# Patient Record
Sex: Female | Born: 1975 | Race: Black or African American | Hispanic: No | Marital: Married | Smoking: Never smoker
Health system: Southern US, Community
[De-identification: ages and names within clinical notes are randomized; demographics above are authoritative.]

## PROBLEM LIST (undated history)

## (undated) DIAGNOSIS — E079 Disorder of thyroid, unspecified: Secondary | ICD-10-CM

## (undated) DIAGNOSIS — I10 Essential (primary) hypertension: Secondary | ICD-10-CM

## (undated) DIAGNOSIS — I2510 Atherosclerotic heart disease of native coronary artery without angina pectoris: Secondary | ICD-10-CM

## (undated) DIAGNOSIS — N83209 Unspecified ovarian cyst, unspecified side: Secondary | ICD-10-CM

## (undated) DIAGNOSIS — D250 Submucous leiomyoma of uterus: Secondary | ICD-10-CM

## (undated) DIAGNOSIS — G43909 Migraine, unspecified, not intractable, without status migrainosus: Secondary | ICD-10-CM

## (undated) DIAGNOSIS — E785 Hyperlipidemia, unspecified: Secondary | ICD-10-CM

## (undated) DIAGNOSIS — Z803 Family history of malignant neoplasm of breast: Secondary | ICD-10-CM

## (undated) HISTORY — DX: Family history of malignant neoplasm of breast: Z80.3

## (undated) HISTORY — PX: COLONOSCOPY: SHX174

## (undated) HISTORY — PX: LAPAROSCOPY: SHX197

## (undated) HISTORY — PX: CORONARY ANGIOPLASTY: SHX604

---

## 2000-07-17 ENCOUNTER — Other Ambulatory Visit: Admission: RE | Admit: 2000-07-17 | Discharge: 2000-07-17 | Payer: Self-pay | Admitting: *Deleted

## 2000-09-26 ENCOUNTER — Encounter (INDEPENDENT_AMBULATORY_CARE_PROVIDER_SITE_OTHER): Payer: Self-pay | Admitting: Specialist

## 2000-09-26 ENCOUNTER — Other Ambulatory Visit: Admission: RE | Admit: 2000-09-26 | Discharge: 2000-09-26 | Payer: Self-pay | Admitting: *Deleted

## 2001-08-18 ENCOUNTER — Other Ambulatory Visit: Admission: RE | Admit: 2001-08-18 | Discharge: 2001-08-18 | Payer: Self-pay | Admitting: *Deleted

## 2002-03-16 ENCOUNTER — Encounter: Admission: RE | Admit: 2002-03-16 | Discharge: 2002-03-16 | Payer: Self-pay | Admitting: *Deleted

## 2002-03-16 ENCOUNTER — Encounter: Payer: Self-pay | Admitting: *Deleted

## 2002-09-13 ENCOUNTER — Other Ambulatory Visit: Admission: RE | Admit: 2002-09-13 | Discharge: 2002-09-13 | Payer: Self-pay | Admitting: *Deleted

## 2002-09-21 ENCOUNTER — Encounter: Payer: Self-pay | Admitting: Endocrinology

## 2002-09-28 ENCOUNTER — Encounter: Payer: Self-pay | Admitting: Endocrinology

## 2002-10-01 ENCOUNTER — Encounter: Payer: Self-pay | Admitting: Endocrinology

## 2002-10-15 ENCOUNTER — Other Ambulatory Visit: Admission: RE | Admit: 2002-10-15 | Discharge: 2002-10-15 | Payer: Self-pay | Admitting: Diagnostic Radiology

## 2002-10-19 ENCOUNTER — Encounter: Payer: Self-pay | Admitting: Endocrinology

## 2002-11-04 ENCOUNTER — Encounter: Payer: Self-pay | Admitting: Endocrinology

## 2002-11-06 ENCOUNTER — Emergency Department (HOSPITAL_COMMUNITY): Admission: EM | Admit: 2002-11-06 | Discharge: 2002-11-06 | Payer: Self-pay

## 2002-11-07 ENCOUNTER — Emergency Department (HOSPITAL_COMMUNITY): Admission: EM | Admit: 2002-11-07 | Discharge: 2002-11-07 | Payer: Self-pay | Admitting: *Deleted

## 2003-04-13 ENCOUNTER — Encounter: Admission: RE | Admit: 2003-04-13 | Discharge: 2003-04-13 | Payer: Self-pay | Admitting: *Deleted

## 2003-04-22 ENCOUNTER — Encounter: Payer: Self-pay | Admitting: Endocrinology

## 2003-04-28 ENCOUNTER — Encounter: Payer: Self-pay | Admitting: Endocrinology

## 2003-05-02 ENCOUNTER — Encounter: Payer: Self-pay | Admitting: Endocrinology

## 2003-08-22 ENCOUNTER — Encounter: Payer: Self-pay | Admitting: Endocrinology

## 2003-08-29 ENCOUNTER — Encounter: Payer: Self-pay | Admitting: Endocrinology

## 2003-09-09 ENCOUNTER — Encounter: Payer: Self-pay | Admitting: Endocrinology

## 2003-09-19 ENCOUNTER — Ambulatory Visit (HOSPITAL_COMMUNITY): Admission: RE | Admit: 2003-09-19 | Discharge: 2003-09-19 | Payer: Self-pay | Admitting: Gastroenterology

## 2003-09-19 ENCOUNTER — Encounter (INDEPENDENT_AMBULATORY_CARE_PROVIDER_SITE_OTHER): Payer: Self-pay | Admitting: Specialist

## 2003-10-03 ENCOUNTER — Other Ambulatory Visit: Admission: RE | Admit: 2003-10-03 | Discharge: 2003-10-03 | Payer: Self-pay | Admitting: *Deleted

## 2003-11-14 ENCOUNTER — Encounter: Payer: Self-pay | Admitting: Endocrinology

## 2004-02-14 ENCOUNTER — Encounter: Payer: Self-pay | Admitting: Endocrinology

## 2004-10-04 ENCOUNTER — Other Ambulatory Visit: Admission: RE | Admit: 2004-10-04 | Discharge: 2004-10-04 | Payer: Self-pay | Admitting: *Deleted

## 2005-09-30 ENCOUNTER — Other Ambulatory Visit: Admission: RE | Admit: 2005-09-30 | Discharge: 2005-09-30 | Payer: Self-pay | Admitting: *Deleted

## 2005-09-30 ENCOUNTER — Encounter: Admission: RE | Admit: 2005-09-30 | Discharge: 2005-09-30 | Payer: Self-pay | Admitting: *Deleted

## 2005-11-12 ENCOUNTER — Encounter: Payer: Self-pay | Admitting: Endocrinology

## 2005-11-15 ENCOUNTER — Encounter: Payer: Self-pay | Admitting: Endocrinology

## 2005-12-09 ENCOUNTER — Other Ambulatory Visit: Admission: RE | Admit: 2005-12-09 | Discharge: 2005-12-09 | Payer: Self-pay | Admitting: Endocrinology

## 2005-12-09 ENCOUNTER — Encounter: Payer: Self-pay | Admitting: Endocrinology

## 2006-01-28 ENCOUNTER — Encounter: Payer: Self-pay | Admitting: Endocrinology

## 2006-02-07 ENCOUNTER — Encounter: Payer: Self-pay | Admitting: Endocrinology

## 2006-02-07 ENCOUNTER — Other Ambulatory Visit: Admission: RE | Admit: 2006-02-07 | Discharge: 2006-02-07 | Payer: Self-pay | Admitting: Diagnostic Radiology

## 2006-06-06 ENCOUNTER — Encounter: Payer: Self-pay | Admitting: Endocrinology

## 2006-10-21 ENCOUNTER — Other Ambulatory Visit: Admission: RE | Admit: 2006-10-21 | Discharge: 2006-10-21 | Payer: Self-pay | Admitting: *Deleted

## 2006-10-29 ENCOUNTER — Encounter: Admission: RE | Admit: 2006-10-29 | Discharge: 2006-10-29 | Payer: Self-pay | Admitting: *Deleted

## 2006-11-04 ENCOUNTER — Encounter: Admission: RE | Admit: 2006-11-04 | Discharge: 2006-11-04 | Payer: Self-pay | Admitting: *Deleted

## 2007-04-01 ENCOUNTER — Ambulatory Visit: Payer: Self-pay | Admitting: Endocrinology

## 2007-04-01 DIAGNOSIS — E042 Nontoxic multinodular goiter: Secondary | ICD-10-CM

## 2007-04-02 LAB — CONVERTED CEMR LAB: TSH: 0.64 microintl units/mL (ref 0.35–5.50)

## 2008-12-12 IMAGING — US US SOFT TISSUE HEAD/NECK
1 series · 14 of 19 positions shown · non-contrast
Comparison: NONE

CLINICAL DATA: c: Dr. Branden Dicarlo thyroid nodule 

FINE-NEEDLE ASPIRATION BIOPSY THYROID NODULE

[Series 1: thyroid fna · 0.07mm/px · 14 of 19 slices shown]
[im 1/19]
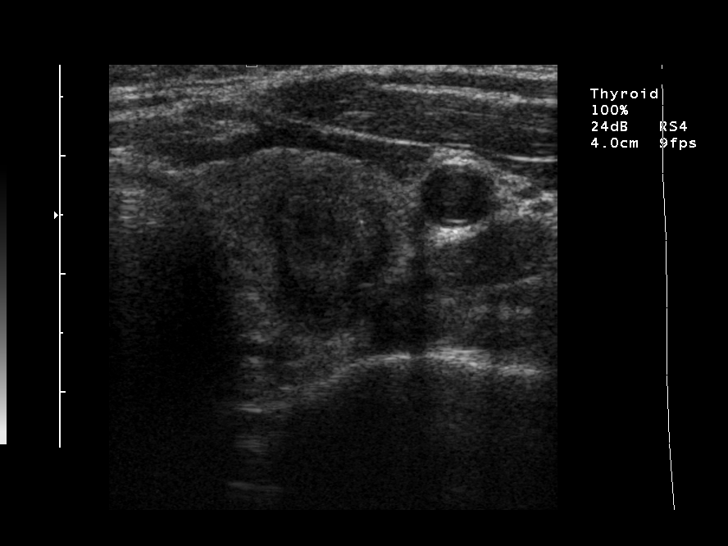
[im 3/19]
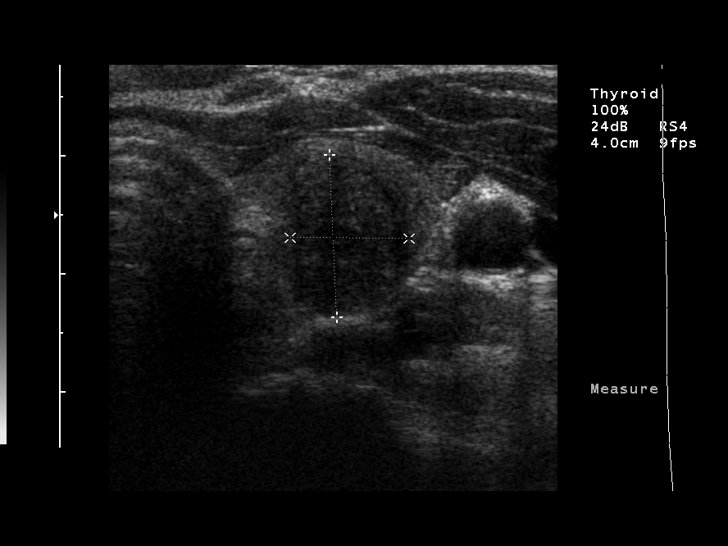
[im 4/19]
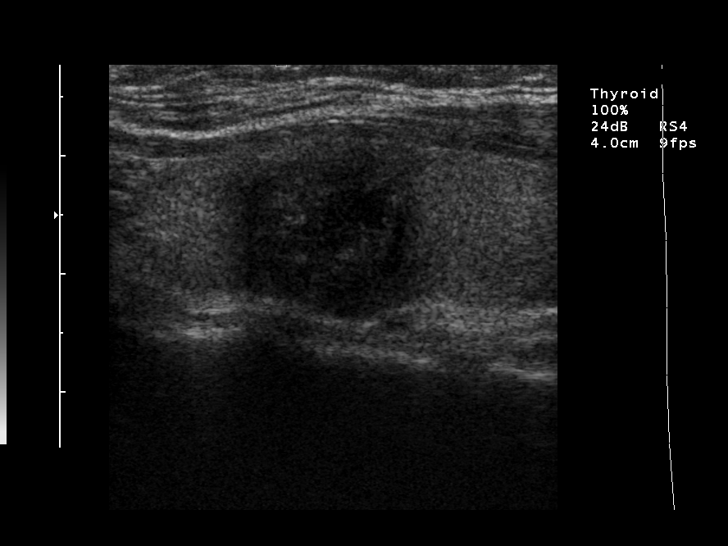
[im 5/19]
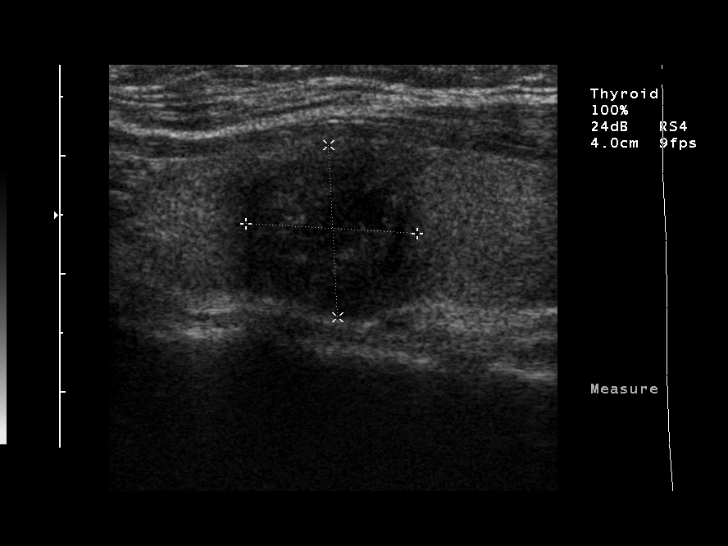
[im 7/19]
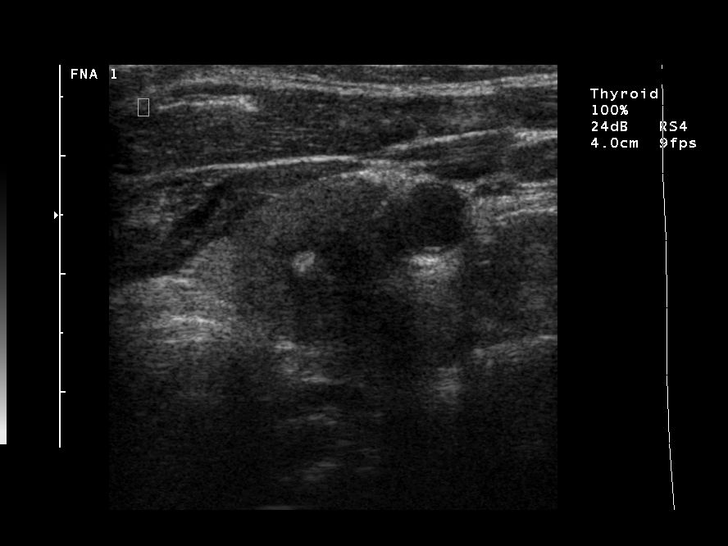
[im 8/19]
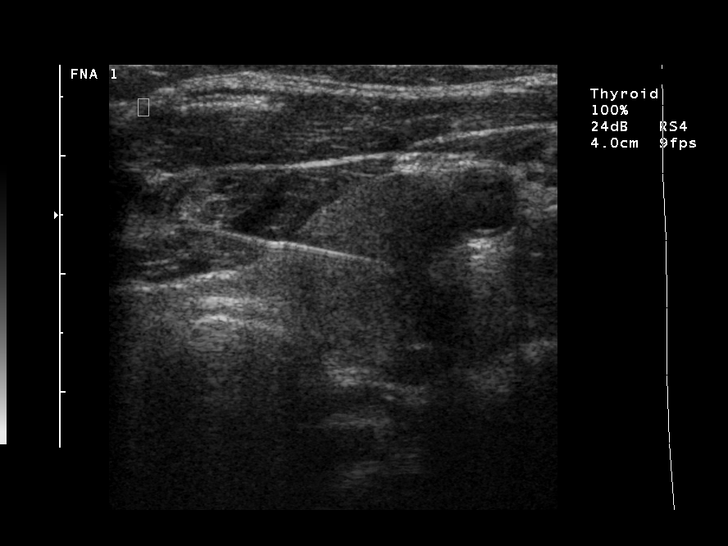
[im 9/19]
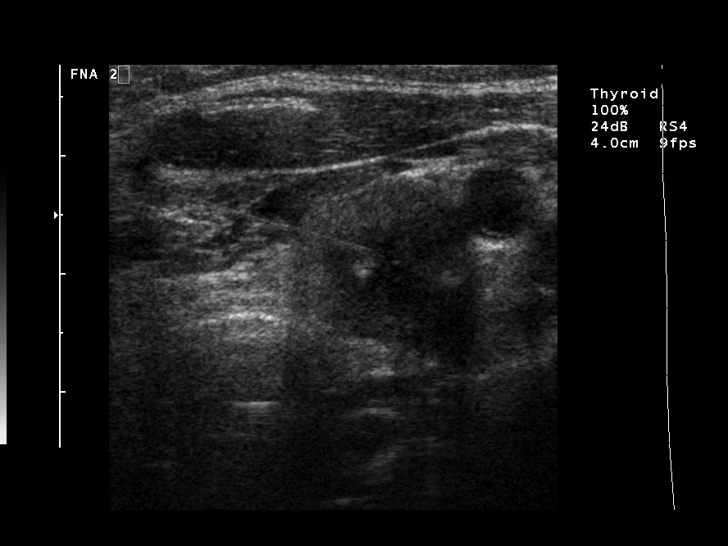
[im 11/19]
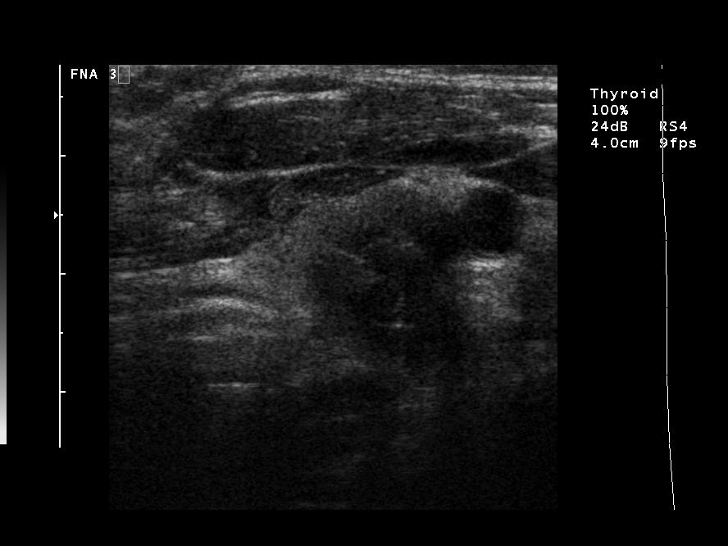
[im 12/19]
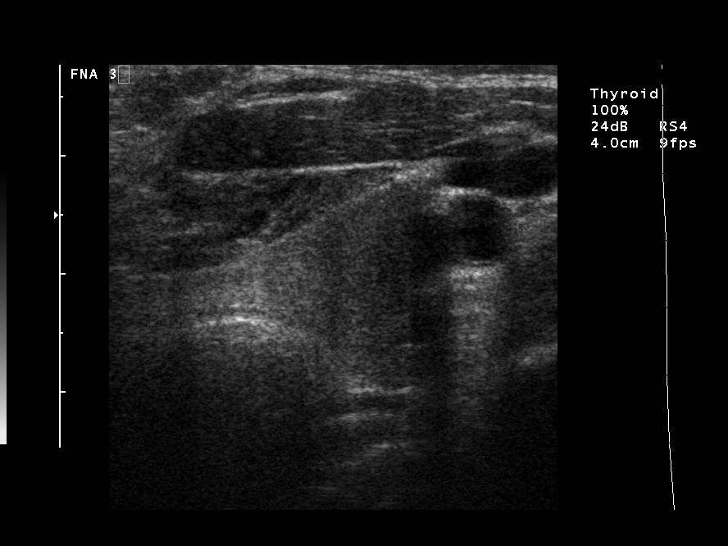
[im 13/19]
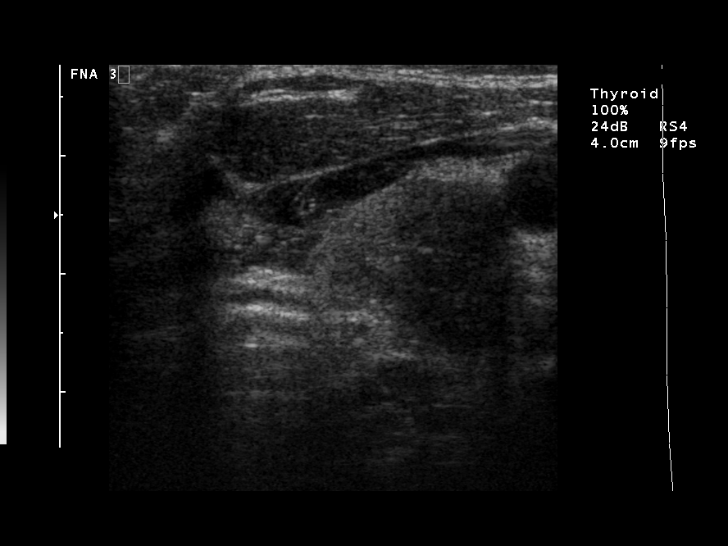
[im 15/19]
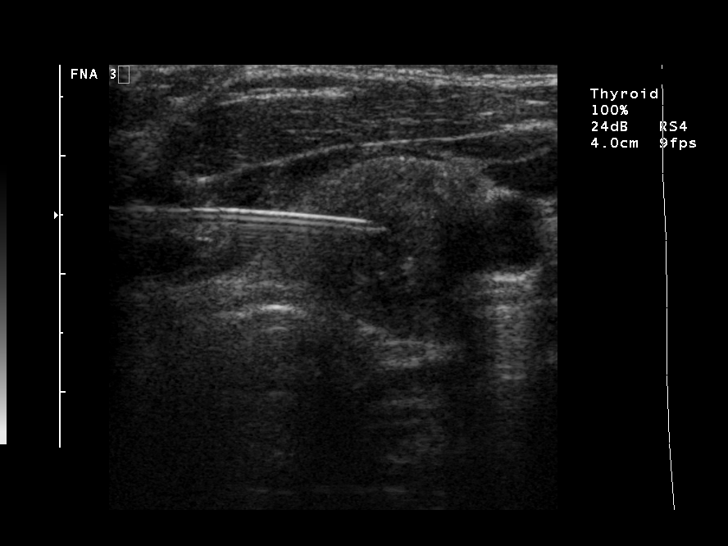
[im 16/19]
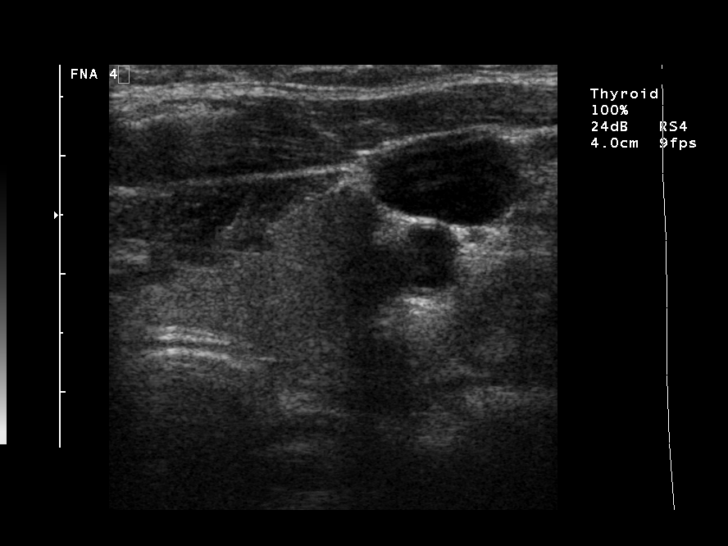
[im 17/19]
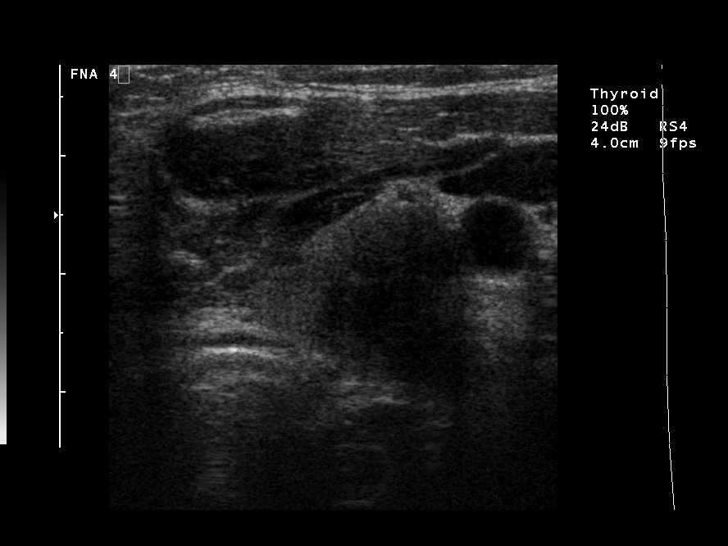
[im 19/19]
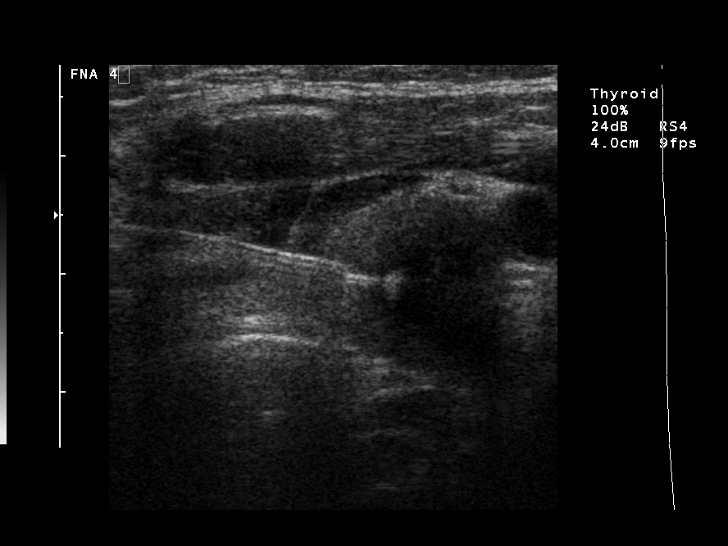

[14 of 19 positions shown; findings below may reference images not displayed]

FINDINGS: Fine-needle aspiration biopsy of a dominant solid left 
thyroid nodule was performed using three 25-gauge needle passes 
followed by two passes with a 21-gauge needle. Material was sent 
for analysis. No complications.
IMPRESSION: Successful repeat FNA of left thyroid. Relindas Auad 
02/07/2006  Tran Date: 02/10/2006 [facility]  [REDACTED]

## 2010-06-29 NOTE — Op Note (Signed)
NAME:  Deanna Solis, Deanna Solis                        ACCOUNT NO.:  [id number]   MEDICAL RECORD NO.:  [id number]                   PATIENT TYPE:  AMB   LOCATION:  ENDO                                 FACILITY:  [facility]   PHYSICIAN:  Anselmo Rod, M.D.               DATE OF BIRTH:  [birth year]   DATE OF PROCEDURE:  09/19/2003  DATE OF DISCHARGE:                                 OPERATIVE REPORT   PROCEDURE PERFORMED:  Colonoscopy with biopsies.   ENDOSCOPIST:  Charna Elizabeth, M.D.   INSTRUMENT USED:  Olympus video colonoscope.   INDICATIONS FOR PROCEDURE:  The patient is a 35 year old African-American  female with a history of rectal bleeding and a family history of colon  cancer.  Rule out colonic polyps, masses, etc.   PREPROCEDURE PREPARATION:  Informed consent was procured from the patient.  The patient was fasted for eight hours prior to the procedure and prepped  with a bottle of magnesium citrate and a gallon of GoLYTELY the night prior  to the procedure.   PREPROCEDURE PHYSICAL:  The patient had stable vital signs.  Neck supple.  Chest clear to auscultation.  S1 and S2 regular.  Abdomen soft with normal  bowel sounds.   DESCRIPTION OF PROCEDURE:  The patient was placed in left lateral decubitus  position and sedated with 100 mg of Demerol and 10 mg of Versed in slow  incremental doses.  Once the patient was adequately sedated and maintained  on low flow oxygen and continuous cardiac monitoring, the Olympus video  colonoscope was advanced from the rectum to the cecum.  The patient had some  abdominal discomfort with insufflation into the colon indicating a component  of visceral hypersensitivity most consistent with irritable bowel syndrome.  A small polyp was biopsied at 40 cm.  Small internal hemorrhoids were seen  on retroflexion.  The rest of the exam was normal.  The appendicular orifice  and ileocecal valve were clearly visualized and photographed.  The patient's  position was  changed from the left lateral supine and the right lateral  position with gentle application of abdominal pressure to reach the cecum.   IMPRESSION:  1. Small nonbleeding internal hemorrhoids.  2. Small sessile polyp biopsied at 40 cm.   RECOMMENDATIONS:  1. Continue high fiber diet with liberal fluid intake.  2. Use stool softeners as needed.  3. Outpatient followup in the next two weeks for further recommendations     depending on pathology results.                                               Anselmo Rod, M.D.    JNM/MEDQ  D:  09/20/2003  T:  09/20/2003  Job:  [id number]   cc:   Gabriel Earing, M.D.  [phone]  [place]  [place] [address]  Fax: [phone]

## 2011-02-12 HISTORY — PX: THYROIDECTOMY: SHX17

## 2013-06-11 ENCOUNTER — Telehealth: Payer: Self-pay | Admitting: Genetic Counselor

## 2013-06-11 ENCOUNTER — Telehealth: Payer: Self-pay | Admitting: Oncology

## 2013-06-11 NOTE — Telephone Encounter (Signed)
S/W PATIENT AND GAVE GENETIC APPT FOR 06/08 @ 10 W/GENETIC COUNSELOR

## 2013-06-11 NOTE — Telephone Encounter (Signed)
MEDICAL RECORDS FAXED TO DR. Cristina Gong OFFICE TO [phone]

## 2013-07-19 ENCOUNTER — Ambulatory Visit (HOSPITAL_BASED_OUTPATIENT_CLINIC_OR_DEPARTMENT_OTHER): Payer: 59 | Admitting: Genetic Counselor

## 2013-07-19 ENCOUNTER — Encounter: Payer: Self-pay | Admitting: Genetic Counselor

## 2013-07-19 ENCOUNTER — Other Ambulatory Visit: Payer: 59

## 2013-07-19 DIAGNOSIS — Z800 Family history of malignant neoplasm of digestive organs: Secondary | ICD-10-CM

## 2013-07-19 DIAGNOSIS — Z803 Family history of malignant neoplasm of breast: Secondary | ICD-10-CM

## 2013-07-19 NOTE — Progress Notes (Signed)
Patient Name: Deanna Solis Patient Age: 38 y.o. Encounter Date: 07/19/2013  Referring Physician: Delila Pereyra, MD  Primary Care Provider: Renato Shin, MD   [name], a 38 y.o. female, is being seen at the [facility] due to a family history of breast cancer.  She presents to clinic today to discuss the possibility of a hereditary predisposition to cancer and discuss whether genetic testing is warranted.  HISTORY OF PRESENT ILLNESS: [name] has no personal history of cancer. She reports that she had an ovarian cyst removed in 2003. She reports that she had a thyroidectomy in 2013 due to a goiter and cysts. She states she has uterine fibroids. [name] has a yearly gynecologic exam, mammogram and clinical breast exam.  Past Medical History  Diagnosis Date  . Family history of malignant neoplasm of breast     History   Social History  . Marital Status: Single    Spouse Name: N/A    Number of Children: N/A  . Years of Education: N/A   Social History Main Topics  . Smoking status: Not on file  . Smokeless tobacco: Not on file  . Alcohol Use: Not on file  . Drug Use: Not on file  . Sexual Activity: Not on file   Other Topics Concern  . Not on file   Social History Narrative  . No narrative on file     FAMILY HISTORY:   During the visit, a 4-generation pedigree was obtained. Significant diagnoses include the following:  Family History  Problem Relation Age of Onset  . Breast cancer Mother 56    deceased 8  . Pancreatic cancer Maternal Grandmother 91    deceased  . Pancreatic cancer Paternal Grandfather 9    deceased    Additionally, [name] has no children and no full siblings. She has two paternal half-brothers.  Deanna Solis's ancestry is African American. There is no known Jewish ancestry and no consanguinity.  ASSESSMENT AND PLAN: [name] is a 38 y.o. female with a family history of breast cancer in her mother at  age 50. This history is not highly suggestive of a hereditary predisposition to cancer, but genetic testing is recommended due to this history and paucity of women in the family. We reviewed the characteristics, features and inheritance patterns of hereditary cancer syndromes. We also discussed genetic testing, including the appropriate family members to test, the process of testing, insurance coverage and implications of results. A negative genetic test will be reassuring for [name], but she is aware that her risk of breast cancer would be somewhat increased due to the family history.  [name] wished to pursue genetic testing and a blood sample will be sent to [facility] for analysis of the BRCA1 and BRCA2 genes. We discussed the implications of a positive, negative and/ or Variant of Uncertain Significance (VUS) result. Results should be available in approximately 3 weeks, at which point we will contact her and address implications for her as well as address genetic testing for at-risk family members, if needed.    We encouraged [name] to remain in contact with Cancer Genetics annually so that we can update the family history and inform her of any changes in cancer genetics and testing that may be of benefit for this family. Ms.  Solis's questions were answered to her satisfaction today.   Thank you for the referral and allowing [place] to share in the care of your patient.  The patient was seen for a total of 30 minutes, greater than 50% of which was spent face-to-face counseling. This patient was discussed with the overseeing provider who agrees with the above.

## 2013-07-30 ENCOUNTER — Encounter: Payer: Self-pay | Admitting: Genetic Counselor

## 2013-07-30 NOTE — Progress Notes (Signed)
Referring Physician: Delila Pereyra, MD   [name] was called today to discuss genetic test results. Please see the Genetics note from her visit on 07/19/13 for a detailed discussion of her personal and family history.  GENETIC TESTING: At the time of Ms. Vincent's visit, we recommended she pursue genetic testing of the BRCA1 and BRCA2 genes. This test, which included sequencing and deletion/duplication analysis, was performed at [facility]. Testing was normal and did not reveal a mutation in either one of these genes. These results are generally reassuring for Ms. Vincent.  We discussed with [name] that since the current test is not perfect, it is possible there may be a gene mutation that current testing cannot detect, but that chance is small. We also discussed that it is possible that a different genetic factor, which was not part of this testing or has not yet been discovered, is responsible for the cancer diagnoses in the family. Should [name] wish to discuss or pursue this additional testing, we are happy to coordinate this at any time, but do not feel that she is at significant risk of harboring a mutation in a different gene.     CANCER SCREENING: This normal result is reassuring and indicates that [name] does not likely have an increased risk of cancer due to a mutation in one of these genes. Given her mother's history of breast cancer, her own risk is still somewhat elevated compared to an average woman. We recommended [name] continue to follow the cancer screening guidelines provided by her primary physician.   Lastly, we discussed with [name] that cancer genetics is a rapidly advancing field and it is possible that new genetic tests will be appropriate for her in the future. We encouraged her to remain in contact with [place] on an annual basis so we can update her personal and family histories, and let her know of advances in cancer genetics that may benefit the  family. Our contact number was provided. Ms. Vincent's questions were answered to her satisfaction today, and she knows she is welcome to call anytime with additional questions.    Steele Berg, MS, [place] Certified Genetic Counseor phone: [phone] [email or web address]

## 2014-07-28 ENCOUNTER — Other Ambulatory Visit: Payer: Self-pay | Admitting: Gynecology

## 2014-07-28 DIAGNOSIS — R928 Other abnormal and inconclusive findings on diagnostic imaging of breast: Secondary | ICD-10-CM

## 2014-08-01 ENCOUNTER — Other Ambulatory Visit: Payer: Self-pay | Admitting: Gynecology

## 2014-08-02 LAB — CYTOLOGY - PAP

## 2014-08-04 ENCOUNTER — Ambulatory Visit
Admission: RE | Admit: 2014-08-04 | Discharge: 2014-08-04 | Disposition: A | Discharge status: Home / Self Care | Payer: 59 | Source: Ambulatory Visit | Attending: Gynecology | Admitting: Gynecology

## 2014-08-04 DIAGNOSIS — R928 Other abnormal and inconclusive findings on diagnostic imaging of breast: Secondary | ICD-10-CM

## 2015-11-24 ENCOUNTER — Encounter (HOSPITAL_COMMUNITY): Payer: Self-pay

## 2015-11-24 ENCOUNTER — Emergency Department (HOSPITAL_COMMUNITY)
Admission: EM | Admit: 2015-11-24 | Discharge: 2015-11-24 | Disposition: A | Discharge status: Home / Self Care | Payer: Commercial Managed Care - HMO | Attending: Emergency Medicine | Admitting: Emergency Medicine

## 2015-11-24 ENCOUNTER — Emergency Department (HOSPITAL_COMMUNITY): Payer: Commercial Managed Care - HMO

## 2015-11-24 DIAGNOSIS — Z79899 Other long term (current) drug therapy: Secondary | ICD-10-CM | POA: Diagnosis not present

## 2015-11-24 DIAGNOSIS — R079 Chest pain, unspecified: Secondary | ICD-10-CM

## 2015-11-24 DIAGNOSIS — R0789 Other chest pain: Secondary | ICD-10-CM | POA: Diagnosis not present

## 2015-11-24 DIAGNOSIS — I10 Essential (primary) hypertension: Secondary | ICD-10-CM | POA: Insufficient documentation

## 2015-11-24 HISTORY — DX: Hyperlipidemia, unspecified: E78.5

## 2015-11-24 HISTORY — DX: Essential (primary) hypertension: I10

## 2015-11-24 HISTORY — DX: Disorder of thyroid, unspecified: E07.9

## 2015-11-24 LAB — BASIC METABOLIC PANEL
ANION GAP: 8 (ref 5–15)
BUN: 7 mg/dL (ref 6–20)
CHLORIDE: 103 mmol/L (ref 101–111)
CO2: 27 mmol/L (ref 22–32)
Calcium: 9.2 mg/dL (ref 8.9–10.3)
Creatinine, Ser: 0.9 mg/dL (ref 0.44–1.00)
GFR calc Af Amer: >60 mL/min (ref >60)
GLUCOSE: 144 mg/dL — AB (ref 65–99)
POTASSIUM: 3 mmol/L — AB (ref 3.5–5.1)
Sodium: 138 mmol/L (ref 135–145)

## 2015-11-24 LAB — CBC
HEMATOCRIT: 39.9 % (ref 36.0–46.0)
HEMOGLOBIN: 13.6 g/dL (ref 12.0–15.0)
MCH: 30.9 pg (ref 26.0–34.0)
MCHC: 34.1 g/dL (ref 30.0–36.0)
MCV: 90.7 fL (ref 78.0–100.0)
Platelets: 301 10*3/uL (ref 150–400)
RBC: 4.4 MIL/uL (ref 3.87–5.11)
RDW: 12.8 % (ref 11.5–15.5)
WBC: 6.1 10*3/uL (ref 4.0–10.5)

## 2015-11-24 LAB — I-STAT TROPONIN, ED
Troponin i, poc: 0 ng/mL (ref 0.00–0.08)
Troponin i, poc: 0 ng/mL (ref 0.00–0.08)

## 2015-11-24 MED ORDER — GI COCKTAIL ~~LOC~~
30.0000 mL | Freq: Once | ORAL | Status: AC
  Administered 2015-11-24: 30 mL via ORAL
  Filled 2015-11-24: qty 30

## 2015-11-24 MED ORDER — POTASSIUM CHLORIDE CRYS ER 20 MEQ PO TBCR
60.0000 meq | EXTENDED_RELEASE_TABLET | Freq: Once | ORAL | Status: AC
Start: 2015-11-24 — End: 2015-11-24
  Administered 2015-11-24: 60 meq via ORAL
  Filled 2015-11-24: qty 3

## 2015-11-24 NOTE — ED Provider Notes (Addendum)
[facility] DEPT Provider Note   CSN: [id number] Arrival date & time: 11/24/15  1439     History   Chief Complaint Chief Complaint  Patient presents with  . Chest Pain    HPI LAVETT LIPS is a 40 y.o. female.  HPI  40 year old female who presents with chest pain. She has history of hypertension and hyperlipidemia. States that 2 nights ago when she was lying back to sleep, she had sudden onset chest pressure. Does not have a history of chest pain. The pain was nonradiating, associated with tingling down the left arm, nausea, and feeling sweaty. She sat up, had ginger ale, burped a few times, and her pain resolved after 15-20 minutes. States that she had mild headache this morning consistent with her migraine headaches and had a dose of ketoprofen today. She subsequently went to work and later on that day had gradual, progressive onset of burning chest pressure and chest pain starting at 2 PM. This been off and on throughout the day, associated with tingling of the left arm. No shortness of breath, diaphoresis, nausea or vomiting. States that she works out to 2 times a week, last on Monday and has never had exertional chest pain or difficulty breathing. With mild chest burning on presenting. No abdominal pain, cough, f/c.  No tobacco use. History of heart disease in maternal grandfather and paternal grandmother when they were in their 52s. No h/o PE/DVT, recent immobilization, exogenous hormone or family history of PE/DVT.  Past Medical History:  Diagnosis Date  . Family history of malignant neoplasm of breast   . Hyperlipidemia   . Hypertension   . Thyroid disease     Patient Active Problem List   Diagnosis Date Noted  . Family history of malignant neoplasm of breast   . GOITER, MULTINODULAR 04/01/2007    Past Surgical History:  Procedure Laterality Date  . COLONOSCOPY     2003 & 2016  . LAPAROSCOPY    . THYROIDECTOMY  2013    OB History    No data available        Home Medications    Prior to Admission medications   Medication Sig Start Date End Date Taking? Authorizing Provider  calcium carbonate (TUMS - DOSED IN MG ELEMENTAL CALCIUM) 500 MG chewable tablet Chew 50 mg by mouth daily. 02/14/14  Yes Historical Provider, MD  folic acid (FOLVITE) A999333 MCG tablet Take 400 mcg by mouth daily.   Yes Historical Provider, MD  hydrochlorothiazide (HYDRODIURIL) 25 MG tablet Take 25 mg by mouth daily. 07/17/15  Yes Historical Provider, MD  ketoprofen (ORUDIS) 75 MG capsule Take 75-150 mg by mouth daily as needed for headache.   Yes Historical Provider, MD  levothyroxine (SYNTHROID, LEVOTHROID) 150 MCG tablet Take 150 mcg by mouth daily. 10/09/15  Yes Historical Provider, MD  loratadine (CLARITIN) 10 MG tablet Take 10 mg by mouth daily.   Yes Historical Provider, MD  Multiple Vitamins tablet Take 1 tablet by mouth daily.   Yes Historical Provider, MD  norethindrone (MICRONOR,CAMILA,ERRIN) 0.35 MG tablet Take 1 tablet by mouth daily. 04/24/15  Yes Historical Provider, MD  rosuvastatin (CRESTOR) 5 MG tablet Take 5 mg by mouth every evening. 10/09/15  Yes Historical Provider, MD    Family History Family History  Problem Relation Age of Onset  . Breast cancer Mother 26    deceased 94  . Pancreatic cancer Maternal Grandmother 40    deceased  . Pancreatic cancer Paternal Grandfather 45  deceased    Social History Social History  Substance Use Topics  . Smoking status: Never Smoker  . Smokeless tobacco: Never Used  . Alcohol use Yes     Comment: social      Allergies   Erythromycin base; Garlic; Onion; Other; Sumatriptan; and Penicillins   Review of Systems Review of Systems 10/14 systems reviewed and are negative other than those stated in the HPI   Physical Exam Updated Vital Signs BP 137/98 (BP Location: Right Arm)   Pulse 92   Temp 98.2 F (36.8 C) (Oral)   Resp (!) 100   Ht 5\' 6"  (1.676 m)   Wt 190 lb (86.2 kg)   LMP 11/10/2015  (Approximate)   SpO2 100%   BMI 30.67 kg/m   Physical Exam Physical Exam  Nursing note and vitals reviewed. Constitutional: Well developed, well nourished, non-toxic, and in no acute distress Head: Normocephalic and atraumatic.  Mouth/Throat: Oropharynx is clear and moist.  Neck: Normal range of motion. Neck supple.  Cardiovascular: Normal rate and regular rhythm.   Pulmonary/Chest: Effort normal and breath sounds normal.  Abdominal: Soft. There is no tenderness. There is no rebound and no guarding.  Musculoskeletal: Normal range of motion. No edema or calf tenderness. Neurological: Alert, no facial droop, fluent speech, moves all extremities symmetrically Skin: Skin is warm and dry.  Psychiatric: Cooperative   ED Treatments / Results  Labs (all labs ordered are listed, but only abnormal results are displayed) Labs Reviewed  BASIC METABOLIC PANEL - Abnormal; Notable for the following:       Result Value   Potassium 3.0 (*)    Glucose, Bld 144 (*)    All other components within normal limits  CBC  I-STAT TROPOININ, ED  I-STAT TROPOININ, ED    EKG  EKG Interpretation  Date/Time:  Friday November 24 2015 22:07:23 EDT Ventricular Rate:  70 PR Interval:  162 QRS Duration: 74 QT Interval:  399 QTC Calculation: 431 R Axis:   8 Text Interpretation:  Sinus rhythm Low voltage, precordial leads Consider anterior infarct No significant change since last tracing Confirmed by Marsalis Beaulieu MD, Madiline Saffran [id number]) on 11/24/2015 10:27:17 PM       Radiology Dg Chest 2 View  Result Date: 11/24/2015 CLINICAL DATA:  Dull pain and numbness in the left side of the face and left arm. Similar symptoms in the chest in the midline to the left for the past 2 days. History of hypertension. EXAM: CHEST  2 VIEW COMPARISON:  None in PACs FINDINGS: The lungs are adequately inflated. There is no focal infiltrate. There is no pneumothorax, pneumomediastinum, or pleural effusion. The heart and pulmonary vascularity  are normal. There is calcification in the wall of the aortic arch. The bony thorax exhibits no acute abnormality. IMPRESSION: There is no active cardiopulmonary disease. Electronically Signed   By: David  Martinique M.D.   On: 11/24/2015 15:44    Procedures Procedures (including critical care time)  Medications Ordered in ED Medications  potassium chloride SA (K-DUR,KLOR-CON) CR tablet 60 mEq (60 mEq Oral Given 11/24/15 2159)  gi cocktail (Maalox,Lidocaine,Donnatal) (30 mLs Oral Given 11/24/15 2210)     Initial Impression / Assessment and Plan / ED Course  I have reviewed the triage vital signs and the nursing notes.  Pertinent labs & imaging results that were available during my care of the patient were reviewed by me and considered in my medical decision making (see chart for details).  Clinical Course    40 year old female  who presents with chest pain. She is nontoxic in no acute distress with stable vital signs at presentation. With mild residual burning chest pain on evaluation. Cardiopulmonary exam is unremarkable. Abdomen is soft and benign. She is low risk for ACS with overall heart score of 2 (nonspecific EKG changes and h/o HTN and HLD). Serial troponins are negative and EKG without dynamic changes and without signs of ischemia or infarction. PERC negative and history not suggestive of PE. No concerns for dissection at this time. No abdominal pain and unlikely biliary colic. ? Potential reflux disease. Symptoms resolved after GI cocktail. Given low risk chest pain, she is felt stable and appropriate for discharge. She will follow-up with PCP. Strict return and follow-up instructions reviewed. She expressed understanding of all discharge instructions and felt comfortable with the plan of care.   Final Clinical Impressions(s) / ED Diagnoses   Final diagnoses:  Nonspecific chest pain    New Prescriptions New Prescriptions   No medications on file     Forde Dandy, MD 11/24/15  Alta Lynkin Saini, MD 11/24/15 2322

## 2015-11-24 NOTE — Discharge Instructions (Signed)
Your heart work-up looks good today.  Please follow-up with your primary care doctor about future stress testing and ECHO.  Return for worsening symptoms, including worsening pain, difficulty breathing, passing out or any other symptoms concerning to you.

## 2015-11-24 NOTE — ED Triage Notes (Signed)
Pt reports chest pain since yesterday. She describes it as an elephant on her chest. She also reports left arm numbness that radiates into the face. Pt denies cardiac hx.

## 2015-12-15 HISTORY — PX: CARDIAC CATHETERIZATION: SHX172

## 2016-02-26 ENCOUNTER — Encounter (HOSPITAL_COMMUNITY): Payer: Self-pay

## 2016-02-27 ENCOUNTER — Encounter (HOSPITAL_COMMUNITY): Payer: Self-pay

## 2016-02-27 ENCOUNTER — Encounter (HOSPITAL_COMMUNITY)
Admission: RE | Admit: 2016-02-27 | Discharge: 2016-02-27 | Disposition: A | Discharge status: Home / Self Care | Payer: Commercial Managed Care - HMO | Source: Ambulatory Visit | Attending: Cardiology | Admitting: Cardiology

## 2016-02-27 VITALS — BP 118/77 | HR 81 | Ht 67.0 in | Wt 207.0 lb

## 2016-02-27 DIAGNOSIS — E079 Disorder of thyroid, unspecified: Secondary | ICD-10-CM | POA: Insufficient documentation

## 2016-02-27 DIAGNOSIS — I10 Essential (primary) hypertension: Secondary | ICD-10-CM | POA: Insufficient documentation

## 2016-02-27 DIAGNOSIS — Z7982 Long term (current) use of aspirin: Secondary | ICD-10-CM | POA: Diagnosis not present

## 2016-02-27 DIAGNOSIS — E785 Hyperlipidemia, unspecified: Secondary | ICD-10-CM | POA: Diagnosis not present

## 2016-02-27 DIAGNOSIS — Z79899 Other long term (current) drug therapy: Secondary | ICD-10-CM | POA: Diagnosis not present

## 2016-02-27 DIAGNOSIS — Z7902 Long term (current) use of antithrombotics/antiplatelets: Secondary | ICD-10-CM | POA: Diagnosis not present

## 2016-02-27 DIAGNOSIS — Z955 Presence of coronary angioplasty implant and graft: Secondary | ICD-10-CM | POA: Diagnosis not present

## 2016-02-27 DIAGNOSIS — I2510 Atherosclerotic heart disease of native coronary artery without angina pectoris: Secondary | ICD-10-CM | POA: Diagnosis not present

## 2016-02-27 HISTORY — DX: Submucous leiomyoma of uterus: D25.0

## 2016-02-27 HISTORY — DX: Unspecified ovarian cyst, unspecified side: N83.209

## 2016-02-27 HISTORY — DX: Migraine, unspecified, not intractable, without status migrainosus: G43.909

## 2016-02-27 HISTORY — DX: Atherosclerotic heart disease of native coronary artery without angina pectoris: I25.10

## 2016-02-27 NOTE — Progress Notes (Signed)
Cardiac Individual Treatment Plan  Patient Details  Name: Audryana Perovich MRN: [id number] Date of Birth: 04/15/1975 Referring Provider:   Flowsheet Row CARDIAC REHAB PHASE II ORIENTATION from 02/27/2016 in [facility]  Referring Provider  Michell Heinrich MD and Vonna Drafts      Initial Encounter Date:  Flowsheet Row CARDIAC REHAB PHASE II ORIENTATION from 02/27/2016 in [facility]  Date  02/27/16  Referring Provider  Michell Heinrich MD and Vonna Drafts      Visit Diagnosis: 12/15/15 Status post coronary artery stent placement of the LAD  Patient's Home Medications on Admission:  Current Outpatient Prescriptions:  .  aspirin EC 81 MG tablet, Take 81 mg by mouth daily., Disp: , Rfl:  .  calcium carbonate (TUMS - DOSED IN MG ELEMENTAL CALCIUM) 500 MG chewable tablet, Chew 1 tablet by mouth daily., Disp: , Rfl:  .  hydrochlorothiazide (HYDRODIURIL) 25 MG tablet, Take 25 mg by mouth daily., Disp: , Rfl:  .  levothyroxine (SYNTHROID, LEVOTHROID) 150 MCG tablet, Take 150 mcg by mouth daily before breakfast., Disp: , Rfl:  .  metoprolol succinate (TOPROL-XL) 25 MG 24 hr tablet, Take 25 mg by mouth daily., Disp: , Rfl:  .  Multiple Vitamin (MULTIVITAMIN) tablet, Take 1 tablet by mouth daily., Disp: , Rfl:  .  norethindrone (MICRONOR,CAMILA,ERRIN) 0.35 MG tablet, Take 1 tablet by mouth daily., Disp: , Rfl:  .  rosuvastatin (CRESTOR) 40 MG tablet, Take 40 mg by mouth every evening., Disp: , Rfl:  .  ticagrelor (BRILINTA) 90 MG TABS tablet, Take 90 mg by mouth 2 (two) times daily., Disp: , Rfl:  .  ketoprofen (ORUDIS) 50 MG capsule, Take 50 mg by mouth every 6 (six) hours as needed (migraines)., Disp: , Rfl:   Past Medical History: Past Medical History:  Diagnosis Date  . Coronary artery disease   . Hyperlipidemia   . Hypertension   . Migraines   . Ovarian cyst   . Submucous uterine fibroid   . Thyroid disease     goiter    Tobacco Use: History  Smoking Status  . Never Smoker  Smokeless Tobacco  . Never Used    Labs: Recent Review Flowsheet Data    There is no flowsheet data to display.      Capillary Blood Glucose: No results found for: GLUCAP   Exercise Target Goals: Date: 02/27/16  Exercise Program Goal: Individual exercise prescription set with THRR, safety & activity barriers. Participant demonstrates ability to understand and report RPE using BORG scale, to self-measure pulse accurately, and to acknowledge the importance of the exercise prescription.  Exercise Prescription Goal: Starting with aerobic activity 30 plus minutes a day, 3 days per week for initial exercise prescription. Provide home exercise prescription and guidelines that participant acknowledges understanding prior to discharge.  Activity Barriers & Risk Stratification:     Activity Barriers & Cardiac Risk Stratification - 02/27/16 1405      Activity Barriers & Cardiac Risk Stratification   Activity Barriers None   Cardiac Risk Stratification Moderate      6 Minute Walk:     6 Minute Walk    Row Name 02/27/16 1356 02/27/16 1401       6 Minute Walk   Phase Initial  -    Distance 1624 feet  -    Distance % Change 0 %  -    Walk Time 6 minutes  -    #  of Rest Breaks 0  -    MPH  - 3.07    METS  - 4.77    RPE 7  -    VO2 Peak  - 16.7    Symptoms No  -    Resting HR 81 bpm  -    Resting BP 118/77  -    Max Ex. HR 113 bpm  -    Max Ex. BP 118/82  -    2 Minute Post BP 115/80  -       Initial Exercise Prescription:     Initial Exercise Prescription - 02/27/16 1400      Date of Initial Exercise RX and Referring Provider   Date 02/27/16   Referring Provider Michell Heinrich MD and Vonna Drafts     Treadmill   MPH 2.7   Grade 1   Minutes 10   METs 3.44     Bike   Level 1   Minutes 10   METs 2.99     NuStep   Level 3   Minutes 10   METs 2     Prescription Details    Frequency (times per week) 3   Duration Progress to 30 minutes of continuous aerobic without signs/symptoms of physical distress     Intensity   THRR 40-80% of Max Heartrate 72-143   Ratings of Perceived Exertion 11-13   Perceived Dyspnea 0-4     Progression   Progression Continue progressive overload as per policy without signs/symptoms or physical distress.     Resistance Training   Training Prescription Yes   Weight 3lbs   Reps 10-12      Perform Capillary Blood Glucose checks as needed.  Exercise Prescription Changes:   Exercise Comments:   Discharge Exercise Prescription (Final Exercise Prescription Changes):   Nutrition:  Target Goals: Understanding of nutrition guidelines, daily intake of sodium 1500mg , cholesterol 200mg , calories 30% from fat and 7% or less from saturated fats, daily to have 5 or more servings of fruits and vegetables.  Biometrics:     Pre Biometrics - 02/27/16 1357      Pre Biometrics   Waist Circumference 36 inches   Hip Circumference 45 inches   Waist to Hip Ratio 0.8 %   Triceps Skinfold 36 mm   % Body Fat 40.6 %   Grip Strength 40 kg   Flexibility 14.5 in   Single Leg Stand 30 seconds       Nutrition Therapy Plan and Nutrition Goals:   Nutrition Discharge: Nutrition Scores:   Nutrition Goals Re-Evaluation:   Psychosocial: Target Goals: Acknowledge presence or absence of depression, maximize coping skills, provide positive support system. Participant is able to verbalize types and ability to use techniques and skills needed for reducing stress and depression.  Initial Review & Psychosocial Screening:     Initial Psych Review & Screening - 02/27/16 [facility]? Yes   Comments brief psychosocial assessment reveals no barriers or further intervention needed at this time     Barriers   Psychosocial barriers to participate in program There are no identifiable barriers or psychosocial  needs.     Screening Interventions   Interventions Encouraged to exercise      Quality of Life Scores:     Quality of Life - 02/27/16 1336      Quality of Life Scores   Health/Function Pre 24 %   Socioeconomic Pre 24.86 %  Psych/Spiritual Pre 28.29 %   Family Pre 22.5 %   GLOBAL Pre 24.91 %      PHQ-9: Recent Review Flowsheet Data    There is no flowsheet data to display.      Psychosocial Evaluation and Intervention:   Psychosocial Re-Evaluation:   Vocational Rehabilitation: Provide vocational rehab assistance to qualifying candidates.   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      Education: Education Goals: Education classes will be provided on a weekly basis, covering required topics. Participant will state understanding/return demonstration of topics presented.  Learning Barriers/Preferences:     Learning Barriers/Preferences - 02/27/16 1406      Learning Barriers/Preferences   Learning Barriers Sight   Learning Preferences Video;Computer/Internet;Pictoral      Education Topics: Count Your Pulse:  -Group instruction provided by verbal instruction, demonstration, patient participation and written materials to support subject.  Instructors address importance of being able to find your pulse and how to count your pulse when at home without a heart monitor.  Patients get hands on experience counting their pulse with staff help and individually.   Heart Attack, Angina, and Risk Factor Modification:  -Group instruction provided by verbal instruction, video, and written materials to support subject.  Instructors address signs and symptoms of angina and heart attacks.    Also discuss risk factors for heart disease and how  to make changes to improve heart health risk factors.   Functional Fitness:  -Group instruction provided by verbal instruction, demonstration, patient participation, and written materials to support subject.  Instructors address safety measures for doing things around the house.  Discuss how to get up and down off the floor, how to pick things up properly, how to safely get out of a chair without assistance, and balance training.   Meditation and Mindfulness:  -Group instruction provided by verbal instruction, patient participation, and written materials to support subject.  Instructor addresses importance of mindfulness and meditation practice to help reduce stress and improve awareness.  Instructor also leads participants through a meditation exercise.    Stretching for Flexibility and Mobility:  -Group instruction provided by verbal instruction, patient participation, and written materials to support subject.  Instructors lead participants through series of stretches that are designed to increase flexibility thus improving mobility.  These stretches are additional exercise for major muscle groups that are typically performed during regular warm up and cool down.   Hands Only CPR Anytime:  -Group instruction provided by verbal instruction, video, patient participation and written materials to support subject.  Instructors co-teach with AHA video for hands only CPR.  Participants get hands on experience with mannequins.   Nutrition I class: Heart Healthy Eating:  -Group instruction provided by PowerPoint slides, verbal discussion, and written materials to support subject matter. The instructor gives an explanation and review of the Therapeutic Lifestyle Changes diet recommendations, which includes a discussion on lipid goals, dietary fat, sodium, fiber, plant stanol/sterol esters, sugar, and the components of a well-balanced, healthy diet.   Nutrition II class: Lifestyle Skills:  -Group  instruction provided by PowerPoint slides, verbal discussion, and written materials to support subject matter. The instructor gives an explanation and review of label reading, grocery shopping for heart health, heart healthy recipe modifications,  and ways to make healthier choices when eating out.   Diabetes Question & Answer:  -Group instruction provided by PowerPoint slides, verbal discussion, and written materials to support subject matter. The instructor gives an explanation and review of diabetes co-morbidities, pre- and post-prandial blood glucose goals, pre-exercise blood glucose goals, signs, symptoms, and treatment of hypoglycemia and hyperglycemia, and foot care basics.   Diabetes Blitz:  -Group instruction provided by PowerPoint slides, verbal discussion, and written materials to support subject matter. The instructor gives an explanation and review of the physiology behind type 1 and type 2 diabetes, diabetes medications and rational behind using different medications, pre- and post-prandial blood glucose recommendations and Hemoglobin A1c goals, diabetes diet, and exercise including blood glucose guidelines for exercising safely.    Portion Distortion:  -Group instruction provided by PowerPoint slides, verbal discussion, written materials, and food models to support subject matter. The instructor gives an explanation of serving size versus portion size, changes in portions sizes over the last 20 years, and what consists of a serving from each food group.   Stress Management:  -Group instruction provided by verbal instruction, video, and written materials to support subject matter.  Instructors review role of stress in heart disease and how to cope with stress positively.     Exercising on Your Own:  -Group instruction provided by verbal instruction, power point, and written materials to support subject.  Instructors discuss benefits of exercise, components of exercise, frequency and  intensity of exercise, and end points for exercise.  Also discuss use of nitroglycerin and activating EMS.  Review options of places to exercise outside of rehab.  Review guidelines for sex with heart disease.   Cardiac Drugs I:  -Group instruction provided by verbal instruction and written materials to support subject.  Instructor reviews cardiac drug classes: antiplatelets, anticoagulants, beta blockers, and statins.  Instructor discusses reasons, side effects, and lifestyle considerations for each drug class.   Cardiac Drugs II:  -Group instruction provided by verbal instruction and written materials to support subject.  Instructor reviews cardiac drug classes: angiotensin converting enzyme inhibitors (ACE-I), angiotensin II receptor blockers (ARBs), nitrates, and calcium channel blockers.  Instructor discusses reasons, side effects, and lifestyle considerations for each drug class.   Anatomy and Physiology of the Circulatory System:  -Group instruction provided by verbal instruction, video, and written materials to support subject.  Reviews functional anatomy of heart, how it relates to various diagnoses, and what role the heart plays in the overall system.   Knowledge Questionnaire Score:     Knowledge Questionnaire Score - 02/27/16 1336      Knowledge Questionnaire Score   Pre Score 23/24      Core Components/Risk Factors/Patient Goals at Admission:     Personal Goals and Risk Factors at Admission - 02/27/16 1610      Core Components/Risk Factors/Patient Goals on Admission    Weight Management Yes;Weight Loss;Weight Maintenance   Intervention Weight Management: Develop a combined nutrition and exercise program designed to reach desired caloric intake, while maintaining appropriate intake of nutrient and fiber, sodium and fats, and appropriate energy expenditure required for the weight goal.;Weight Management: Provide education and appropriate resources to help participant work  on and attain dietary goals.;Weight Management/Obesity: Establish reasonable short term and long term weight goals.;Obesity: Provide education and appropriate resources to help participant work on and attain dietary goals.   Expected Outcomes Short Term: Continue to assess and modify interventions until short term weight is achieved;Weight Maintenance: Understanding of the daily  nutrition guidelines, which includes 25-35% calories from fat, 7% or less cal from saturated fats, less than 200mg  cholesterol, less than 1.5gm of sodium, & 5 or more servings of fruits and vegetables daily;Weight Loss: Understanding of general recommendations for a balanced deficit meal plan, which promotes 1-2 lb weight loss per week and includes a negative energy balance of [phone] kcal/d;Understanding recommendations for meals to include 15-35% energy as protein, 25-35% energy from fat, 35-60% energy from carbohydrates, less than 200mg  of dietary cholesterol, 20-35 gm of total fiber daily;Understanding of distribution of calorie intake throughout the day with the consumption of 4-5 meals/snacks;Weight Gain: Understanding of general recommendations for a high calorie, high protein meal plan that promotes weight gain by distributing calorie intake throughout the day with the consumption for 4-5 meals, snacks, and/or supplements   Sedentary Yes   Intervention Provide advice, education, support and counseling about physical activity/exercise needs.;Develop an individualized exercise prescription for aerobic and resistive training based on initial evaluation findings, risk stratification, comorbidities and participant's personal goals.   Expected Outcomes Achievement of increased cardiorespiratory fitness and enhanced flexibility, muscular endurance and strength shown through measurements of functional capacity and personal statement of participant.   Increase Strength and Stamina Yes   Intervention Provide advice, education, support  and counseling about physical activity/exercise needs.;Develop an individualized exercise prescription for aerobic and resistive training based on initial evaluation findings, risk stratification, comorbidities and participant's personal goals.   Expected Outcomes Achievement of increased cardiorespiratory fitness and enhanced flexibility, muscular endurance and strength shown through measurements of functional capacity and personal statement of participant.   Hypertension Yes   Intervention Provide education on lifestyle modifcations including regular physical activity/exercise, weight management, moderate sodium restriction and increased consumption of fresh fruit, vegetables, and low fat dairy, alcohol moderation, and smoking cessation.;Monitor prescription use compliance.   Expected Outcomes Short Term: Continued assessment and intervention until BP is < 140/74mm HG in hypertensive participants. < 130/55mm HG in hypertensive participants with diabetes, heart failure or chronic kidney disease.;Long Term: Maintenance of blood pressure at goal levels.   Lipids Yes   Intervention Provide education and support for participant on nutrition & aerobic/resistive exercise along with prescribed medications to achieve LDL 70mg , HDL >40mg .   Expected Outcomes Short Term: Participant states understanding of desired cholesterol values and is compliant with medications prescribed. Participant is following exercise prescription and nutrition guidelines.;Long Term: Cholesterol controlled with medications as prescribed, with individualized exercise RX and with personalized nutrition plan. Value goals: LDL < 70mg , HDL > 40 mg.   Stress Yes   Intervention Offer individual and/or small group education and counseling on adjustment to heart disease, stress management and health-related lifestyle change. Teach and support self-help strategies.;Refer participants experiencing significant psychosocial distress to appropriate  mental health specialists for further evaluation and treatment. When possible, include family members and significant others in education/counseling sessions.   Expected Outcomes Short Term: Participant demonstrates changes in health-related behavior, relaxation and other stress management skills, ability to obtain effective social support, and compliance with psychotropic medications if prescribed.;Long Term: Emotional wellbeing is indicated by absence of clinically significant psychosocial distress or social isolation.   Personal Goal Other Yes   Personal Goal Return to running and lose 20lbs   Intervention Provide nutrition counseling and exercise programming to assist with improving cardiovascular fitness and weightloss   Expected Outcomes Pt will lose weight and get back to running      Core Components/Risk Factors/Patient Goals Review:    Core Components/Risk Factors/Patient Goals  at Discharge (Final Review):    ITP Comments:     ITP Comments    Row Name 02/27/16 0922           ITP Comments Dr. Fransico Him, Medical Director          Comments: Aminata attended orientation from 0800 to 1030 to review rules and guidelines for program. Completed 6 minute walk test, Intitial ITP, and exercise prescription.  VSS. Telemetry-Sinus Rhythm. Dr Lamar Blinks is Mrs Panas's cardiologist at [facility] in [place]. Asymptomatic.Barnet Pall, RN,BSN 02/27/2016 4:15 PM

## 2016-02-27 NOTE — Progress Notes (Signed)
Cardiac Rehab Medication Review by a Pharmacist  Does the patient  feel that his/her medications are working for him/her?  yes  Has the patient been experiencing any side effects to the medications prescribed?  Yes - she is experiencing some fatigue, which could be related to BB. We discussed how this side effect starts to resolve as her body becomes tolerant of the medication.   Does the patient measure his/her own blood pressure or blood glucose at home?  yes - numbers are stable and controlled (< 130/80) without symptoms of hypotension  Does the patient have any problems obtaining medications due to transportation or finances?   no  Understanding of regimen: excellent Understanding of indications: excellent Potential of compliance: excellent   Pharmacist comments: 13 yoF presents to cardiac rehab in good spirits without assistance. She voiced frustration with not being able to run and being sedentary. We discussed how her heart is a muscle that needs to recover and re-train itself, like marathon training. Secondly, she was concerned with the number of medications she's taking. We discussed the importance of each medication. She voiced understanding and approval of her regimen.   Belia Heman, PharmD PGY1 Pharmacy Resident [phone] (Pager) 02/27/2016 8:41 AM

## 2016-02-28 ENCOUNTER — Encounter (HOSPITAL_COMMUNITY): Payer: Commercial Managed Care - HMO

## 2016-03-01 ENCOUNTER — Encounter (HOSPITAL_COMMUNITY): Payer: Commercial Managed Care - HMO

## 2016-03-04 ENCOUNTER — Encounter (HOSPITAL_COMMUNITY)
Admission: RE | Admit: 2016-03-04 | Discharge: 2016-03-04 | Disposition: A | Discharge status: Home / Self Care | Payer: Commercial Managed Care - HMO | Source: Ambulatory Visit | Attending: Cardiology | Admitting: Cardiology

## 2016-03-04 DIAGNOSIS — Z955 Presence of coronary angioplasty implant and graft: Secondary | ICD-10-CM

## 2016-03-04 NOTE — Progress Notes (Signed)
Daily Session Note  Patient Details  Name: Deanna Solis MRN: [id number] Date of Birth: 01-Mar-1975 Referring Provider:   Flowsheet Row CARDIAC REHAB PHASE II ORIENTATION from 02/27/2016 in [facility]  Referring Provider  Michell Heinrich MD and Vonna Drafts      Encounter Date: 03/04/2016  Check In:     Session Check In - 03/04/16 0645      Check-In   Location [facility]   Staff Present Cleda Mccreedy, MS, Exercise Physiologist;Molly diVincenzo, MS, ACSM RCEP, Exercise Physiologist;Portia Rollene Rotunda, RN, BSN;Jaanai Salemi Wilber Oliphant, RN, BSN   Supervising physician immediately available to respond to emergencies [facility] immediately available   Physician(s) Dr. Wyline Copas   Medication changes reported     Yes   Comments toprol decreased to 12.5 til thursday then d/c   Fall or balance concerns reported    No   Warm-up and Cool-down Performed as group-led instruction   Resistance Training Performed No   VAD Patient? No     Pain Assessment   Currently in Pain? No/denies   Multiple Pain Sites No      Capillary Blood Glucose: No results found for this or any previous visit (from the past 24 hour(s)).   Goals Met:  Exercise tolerated well Personal goals reviewed  Goals Unmet:  Not Applicable  Comments:  Pt started full exercise at phase II cardiac rehab today.  Pt tolerated light exercise without difficulty. VSS, telemetry-Sr with no ectopy, asymptomatic.  Medication list reconciled. Pt denies barriers to medication compliance. Pt with recent follow up appt with cardiologist last Friday.  Pt will be weaned off Toprol XL and is presently taking 12.5 mg and will stop on Thursday. Pt  PSYCHOSOCIAL ASSESSMENT:  PHQ-0. Pt completed pre assessment quality of life survey.  Pt scored the following     Quality of Life - 02/27/16 1336      Quality of Life Scores   Health/Function Pre 24 %   Socioeconomic Pre 24.86 %   Psych/Spiritual Pre 28.29 %   Family Pre 22.5 %   GLOBAL Pre 24.91 %    Pt exhibits positive coping skills, hopeful outlook with supportive family which includes her spouse, family, friends and co workers. No psychosocial needs identified at this time, no psychosocial interventions necessary.    Pt enjoys running, reading and traveling.  Pt desires to get back to running, lose 20 pounds. Pt enjoys exercising and was successful in this in the past.  Pt is hopeful cardiac rehab will help her get back in a routine.Pt oriented to exercise equipment and routine. Understanding verbalized.  Dr. Fransico Him is Medical Director for Cardiac Rehab at [facility] RN, BSN

## 2016-03-06 ENCOUNTER — Encounter (HOSPITAL_COMMUNITY)
Admission: RE | Admit: 2016-03-06 | Discharge: 2016-03-06 | Disposition: A | Discharge status: Home / Self Care | Payer: Commercial Managed Care - HMO | Source: Ambulatory Visit | Attending: Cardiology | Admitting: Cardiology

## 2016-03-06 ENCOUNTER — Encounter (HOSPITAL_COMMUNITY): Payer: Self-pay

## 2016-03-06 DIAGNOSIS — Z955 Presence of coronary angioplasty implant and graft: Secondary | ICD-10-CM | POA: Diagnosis not present

## 2016-03-08 ENCOUNTER — Encounter (HOSPITAL_COMMUNITY)
Admission: RE | Admit: 2016-03-08 | Discharge: 2016-03-08 | Disposition: A | Discharge status: Home / Self Care | Payer: Commercial Managed Care - HMO | Source: Ambulatory Visit | Attending: Cardiology | Admitting: Cardiology

## 2016-03-08 DIAGNOSIS — Z955 Presence of coronary angioplasty implant and graft: Secondary | ICD-10-CM | POA: Diagnosis not present

## 2016-03-11 ENCOUNTER — Encounter (HOSPITAL_COMMUNITY)
Admission: RE | Admit: 2016-03-11 | Discharge: 2016-03-11 | Disposition: A | Discharge status: Home / Self Care | Payer: Commercial Managed Care - HMO | Source: Ambulatory Visit | Attending: Cardiology | Admitting: Cardiology

## 2016-03-11 DIAGNOSIS — Z955 Presence of coronary angioplasty implant and graft: Secondary | ICD-10-CM | POA: Diagnosis not present

## 2016-03-13 ENCOUNTER — Encounter (HOSPITAL_COMMUNITY)
Admission: RE | Admit: 2016-03-13 | Discharge: 2016-03-13 | Disposition: A | Discharge status: Home / Self Care | Payer: Commercial Managed Care - HMO | Source: Ambulatory Visit | Attending: Cardiology | Admitting: Cardiology

## 2016-03-13 DIAGNOSIS — Z955 Presence of coronary angioplasty implant and graft: Secondary | ICD-10-CM

## 2016-03-13 NOTE — Progress Notes (Signed)
Reviewed home exercise program with pt.  Discussed mode/frequency of exercise, THRR, RPE scale and weather conditions for exercising outdoors.  Also discussed signs and symptoms and when to call Dr./911.  Pt states that she currently walks daily at work with a walking group during her lunch break.  She walks approximately 1 mile/day on days off from [facility].    Cleda Mccreedy, [place] 03/13/2016 1116

## 2016-03-14 NOTE — Progress Notes (Signed)
Cardiac Individual Treatment Plan  Patient Details  Name: Deanna Solis MRN: [id number] Date of Birth: 03-Jan-1976 Referring Provider:   Flowsheet Row CARDIAC REHAB PHASE II ORIENTATION from 02/27/2016 in [place]  Referring Provider  Michell Heinrich MD and Vonna Drafts      Initial Encounter Date:  Flowsheet Row CARDIAC REHAB PHASE II ORIENTATION from 02/27/2016 in [place]  Date  02/27/16  Referring Provider  Michell Heinrich MD and Vonna Drafts      Visit Diagnosis: 12/15/15 Status post coronary artery stent placement of the LAD  Patient's Home Medications on Admission:  Current Outpatient Prescriptions:  .  aspirin EC 81 MG tablet, Take 81 mg by mouth daily., Disp: , Rfl:  .  calcium carbonate (TUMS - DOSED IN MG ELEMENTAL CALCIUM) 500 MG chewable tablet, Chew 50 mg by mouth daily., Disp: , Rfl:  .  calcium carbonate (TUMS - DOSED IN MG ELEMENTAL CALCIUM) 500 MG chewable tablet, Chew 1 tablet by mouth daily., Disp: , Rfl:  .  folic acid (FOLVITE) 947 MCG tablet, Take 400 mcg by mouth daily., Disp: , Rfl:  .  hydrochlorothiazide (HYDRODIURIL) 25 MG tablet, Take 25 mg by mouth daily., Disp: , Rfl:  .  hydrochlorothiazide (HYDRODIURIL) 25 MG tablet, Take 25 mg by mouth daily., Disp: , Rfl:  .  ketoprofen (ORUDIS) 50 MG capsule, Take 50 mg by mouth every 6 (six) hours as needed (migraines)., Disp: , Rfl:  .  ketoprofen (ORUDIS) 75 MG capsule, Take 75-150 mg by mouth daily as needed for headache., Disp: , Rfl:  .  levothyroxine (SYNTHROID, LEVOTHROID) 150 MCG tablet, Take 150 mcg by mouth daily., Disp: , Rfl:  .  levothyroxine (SYNTHROID, LEVOTHROID) 150 MCG tablet, Take 150 mcg by mouth daily before breakfast., Disp: , Rfl:  .  loratadine (CLARITIN) 10 MG tablet, Take 10 mg by mouth daily., Disp: , Rfl:  .  metoprolol succinate (TOPROL-XL) 25 MG 24 hr tablet, Take 25 mg by mouth daily., Disp: , Rfl:  .   Multiple Vitamin (MULTIVITAMIN) tablet, Take 1 tablet by mouth daily., Disp: , Rfl:  .  Multiple Vitamins tablet, Take 1 tablet by mouth daily., Disp: , Rfl:  .  norethindrone (MICRONOR,CAMILA,ERRIN) 0.35 MG tablet, Take 1 tablet by mouth daily., Disp: , Rfl:  .  norethindrone (MICRONOR,CAMILA,ERRIN) 0.35 MG tablet, Take 1 tablet by mouth daily., Disp: , Rfl:  .  rosuvastatin (CRESTOR) 40 MG tablet, Take 40 mg by mouth every evening., Disp: , Rfl:  .  rosuvastatin (CRESTOR) 5 MG tablet, Take 5 mg by mouth every evening., Disp: , Rfl:  .  ticagrelor (BRILINTA) 90 MG TABS tablet, Take 90 mg by mouth 2 (two) times daily., Disp: , Rfl:   Past Medical History: Past Medical History:  Diagnosis Date  . Coronary artery disease   . Family history of malignant neoplasm of breast   . Hyperlipidemia   . Hypertension   . Migraines   . Ovarian cyst   . Submucous uterine fibroid   . Thyroid disease   . Thyroid disease    goiter    Tobacco Use: History  Smoking Status  . Never Smoker  Smokeless Tobacco  . Never Used    Labs: Recent Review Flowsheet Data    There is no flowsheet data to display.      Capillary Blood Glucose: No results found for: GLUCAP   Exercise Target Goals:    Exercise  Program Goal: Individual exercise prescription set with THRR, safety & activity barriers. Participant demonstrates ability to understand and report RPE using BORG scale, to self-measure pulse accurately, and to acknowledge the importance of the exercise prescription.  Exercise Prescription Goal: Starting with aerobic activity 30 plus minutes a day, 3 days per week for initial exercise prescription. Provide home exercise prescription and guidelines that participant acknowledges understanding prior to discharge.  Activity Barriers & Risk Stratification:     Activity Barriers & Cardiac Risk Stratification - 02/27/16 1405      Activity Barriers & Cardiac Risk Stratification   Activity Barriers  None   Cardiac Risk Stratification Moderate      6 Minute Walk:     6 Minute Walk    Row Name 02/27/16 1356 02/27/16 1401       6 Minute Walk   Phase Initial  -    Distance 1624 feet  -    Distance % Change 0 %  -    Walk Time 6 minutes  -    # of Rest Breaks 0  -    MPH  - 3.07    METS  - 4.77    RPE 7  -    VO2 Peak  - 16.7    Symptoms No  -    Resting HR 81 bpm  -    Resting BP 118/77  -    Max Ex. HR 113 bpm  -    Max Ex. BP 118/82  -    2 Minute Post BP 115/80  -       Initial Exercise Prescription:     Initial Exercise Prescription - 02/27/16 1400      Date of Initial Exercise RX and Referring Provider   Date 02/27/16   Referring Provider Michell Heinrich MD and Vonna Drafts     Treadmill   MPH 2.7   Grade 1   Minutes 10   METs 3.44     Bike   Level 1   Minutes 10   METs 2.99     NuStep   Level 3   Minutes 10   METs 2     Prescription Details   Frequency (times per week) 3   Duration Progress to 30 minutes of continuous aerobic without signs/symptoms of physical distress     Intensity   THRR 40-80% of Max Heartrate 72-143   Ratings of Perceived Exertion 11-13   Perceived Dyspnea 0-4     Progression   Progression Continue progressive overload as per policy without signs/symptoms or physical distress.     Resistance Training   Training Prescription Yes   Weight 3lbs   Reps 10-12      Perform Capillary Blood Glucose checks as needed.  Exercise Prescription Changes:   Exercise Comments:   Discharge Exercise Prescription (Final Exercise Prescription Changes):   Nutrition:  Target Goals: Understanding of nutrition guidelines, daily intake of sodium 1500mg , cholesterol 200mg , calories 30% from fat and 7% or less from saturated fats, daily to have 5 or more servings of fruits and vegetables.  Biometrics:     Pre Biometrics - 02/27/16 1357      Pre Biometrics   Waist Circumference 36 inches   Hip Circumference 45 inches    Waist to Hip Ratio 0.8 %   Triceps Skinfold 36 mm   % Body Fat 40.6 %   Grip Strength 40 kg   Flexibility 14.5 in   Single Leg Stand 30  seconds       Nutrition Therapy Plan and Nutrition Goals:     Nutrition Therapy & Goals - 03/04/16 1217      Nutrition Therapy   Diet Therapeutic Lifestyle Changes     Personal Nutrition Goals   Personal Goal #1 1-2 lb wt loss/week to a wt loss goal of 6-24 lb at graduation from [place], educate and counsel regarding individualized specific dietary modifications aiming towards targeted core components such as weight, hypertension, lipid management, diabetes, heart failure and other comorbidities.   Expected Outcomes Short Term Goal: Understand basic principles of dietary content, such as calories, fat, sodium, cholesterol and nutrients.;Long Term Goal: Adherence to prescribed nutrition plan.      Nutrition Discharge: Nutrition Scores:   Nutrition Goals Re-Evaluation:   Psychosocial: Target Goals: Acknowledge presence or absence of depression, maximize coping skills, provide positive support system. Participant is able to verbalize types and ability to use techniques and skills needed for reducing stress and depression.  Initial Review & Psychosocial Screening:     Initial Psych Review & Screening - 02/27/16 [place]? Yes   Comments brief psychosocial assessment reveals no barriers or further intervention needed at this time     Barriers   Psychosocial barriers to participate in program There are no identifiable barriers or psychosocial needs.     Screening Interventions   Interventions Encouraged to exercise      Quality of Life Scores:     Quality of Life - 02/27/16 1336      Quality of Life Scores   Health/Function Pre 24 %   Socioeconomic Pre 24.86 %   Psych/Spiritual Pre 28.29 %   Family Pre 22.5 %   GLOBAL Pre 24.91 %       PHQ-9: Recent Review Flowsheet Data    Depression screen [facility] 2/9 03/04/2016   Decreased Interest 0   Down, Depressed, Hopeless 0   PHQ - 2 Score 0      Psychosocial Evaluation and Intervention:     Psychosocial Evaluation - 03/14/16 1438      Psychosocial Evaluation & Interventions   Interventions Encouraged to exercise with the program and follow exercise prescription;Stress management education;Relaxation education   Comments Pt with no identifiable barriers to cardiac rehab   Continued Psychosocial Services Needed No      Psychosocial Re-Evaluation:     Psychosocial Re-Evaluation    Row Name 03/04/16 0905 03/14/16 1439           Psychosocial Re-Evaluation   Interventions Encouraged to attend Cardiac Rehabilitation for the exercise Encouraged to attend Cardiac Rehabilitation for the exercise;Relaxation education;Stress management education      Continued Psychosocial Services Needed  - No         Vocational Rehabilitation: Provide vocational rehab assistance to qualifying candidates.   Vocational Rehab Evaluation & Intervention:     Vocational Rehab - 03/04/16 0915      Initial Vocational Rehab Evaluation & Intervention   Assessment shows need for Vocational Rehabilitation No      Education: Education Goals: Education classes will be provided on a weekly basis, covering required topics. Participant will state understanding/return demonstration of topics presented.  Learning Barriers/Preferences:     Learning Barriers/Preferences - 02/27/16 1406      Learning Barriers/Preferences   Learning Barriers Sight   Learning Preferences Video;Computer/Internet;Pictoral      Education  Topics: Count Your Pulse:  -Group instruction provided by verbal instruction, demonstration, patient participation and written materials to support subject.  Instructors address importance of being able to find your pulse and how to count your pulse when at home without a  heart monitor.  Patients get hands on experience counting their pulse with staff help and individually.   Heart Attack, Angina, and Risk Factor Modification:  -Group instruction provided by verbal instruction, video, and written materials to support subject.  Instructors address signs and symptoms of angina and heart attacks.    Also discuss risk factors for heart disease and how to make changes to improve heart health risk factors. Flowsheet Row CARDIAC REHAB PHASE II EXERCISE from 03/13/2016 in [place]  Date  03/06/16  Instruction Review Code  2- meets goals/outcomes      Functional Fitness:  -Group instruction provided by verbal instruction, demonstration, patient participation, and written materials to support subject.  Instructors address safety measures for doing things around the house.  Discuss how to get up and down off the floor, how to pick things up properly, how to safely get out of a chair without assistance, and balance training.   Meditation and Mindfulness:  -Group instruction provided by verbal instruction, patient participation, and written materials to support subject.  Instructor addresses importance of mindfulness and meditation practice to help reduce stress and improve awareness.  Instructor also leads participants through a meditation exercise.    Stretching for Flexibility and Mobility:  -Group instruction provided by verbal instruction, patient participation, and written materials to support subject.  Instructors lead participants through series of stretches that are designed to increase flexibility thus improving mobility.  These stretches are additional exercise for major muscle groups that are typically performed during regular warm up and cool down. Flowsheet Row CARDIAC REHAB PHASE II EXERCISE from 03/13/2016 in [place]  Date  03/08/16  Instruction Review Code  2- meets goals/outcomes       Hands Only CPR Anytime:  -Group instruction provided by verbal instruction, video, patient participation and written materials to support subject.  Instructors co-teach with AHA video for hands only CPR.  Participants get hands on experience with mannequins.   Nutrition I class: Heart Healthy Eating:  -Group instruction provided by PowerPoint slides, verbal discussion, and written materials to support subject matter. The instructor gives an explanation and review of the Therapeutic Lifestyle Changes diet recommendations, which includes a discussion on lipid goals, dietary fat, sodium, fiber, plant stanol/sterol esters, sugar, and the components of a well-balanced, healthy diet.   Nutrition II class: Lifestyle Skills:  -Group instruction provided by PowerPoint slides, verbal discussion, and written materials to support subject matter. The instructor gives an explanation and review of label reading, grocery shopping for heart health, heart healthy recipe modifications, and ways to make healthier choices when eating out.   Diabetes Question & Answer:  -Group instruction provided by PowerPoint slides, verbal discussion, and written materials to support subject matter. The instructor gives an explanation and review of diabetes co-morbidities, pre- and post-prandial blood glucose goals, pre-exercise blood glucose goals, signs, symptoms, and treatment of hypoglycemia and hyperglycemia, and foot care basics.   Diabetes Blitz:  -Group instruction provided by PowerPoint slides, verbal discussion, and written materials to support subject matter. The instructor gives an explanation and review of the physiology behind type 1 and type 2 diabetes, diabetes medications and rational behind using different medications, pre- and post-prandial blood glucose  recommendations and Hemoglobin A1c goals, diabetes diet, and exercise including blood glucose guidelines for exercising safely.    Portion Distortion:   -Group instruction provided by PowerPoint slides, verbal discussion, written materials, and food models to support subject matter. The instructor gives an explanation of serving size versus portion size, changes in portions sizes over the last 20 years, and what consists of a serving from each food group.   Stress Management:  -Group instruction provided by verbal instruction, video, and written materials to support subject matter.  Instructors review role of stress in heart disease and how to cope with stress positively.     Exercising on Your Own:  -Group instruction provided by verbal instruction, power point, and written materials to support subject.  Instructors discuss benefits of exercise, components of exercise, frequency and intensity of exercise, and end points for exercise.  Also discuss use of nitroglycerin and activating EMS.  Review options of places to exercise outside of rehab.  Review guidelines for sex with heart disease. Flowsheet Row CARDIAC REHAB PHASE II EXERCISE from 03/13/2016 in [place]  Date  03/13/16  Instruction Review Code  2- meets goals/outcomes      Cardiac Drugs I:  -Group instruction provided by verbal instruction and written materials to support subject.  Instructor reviews cardiac drug classes: antiplatelets, anticoagulants, beta blockers, and statins.  Instructor discusses reasons, side effects, and lifestyle considerations for each drug class.   Cardiac Drugs II:  -Group instruction provided by verbal instruction and written materials to support subject.  Instructor reviews cardiac drug classes: angiotensin converting enzyme inhibitors (ACE-I), angiotensin II receptor blockers (ARBs), nitrates, and calcium channel blockers.  Instructor discusses reasons, side effects, and lifestyle considerations for each drug class.   Anatomy and Physiology of the Circulatory System:  -Group instruction provided by verbal instruction,  video, and written materials to support subject.  Reviews functional anatomy of heart, how it relates to various diagnoses, and what role the heart plays in the overall system.   Knowledge Questionnaire Score:     Knowledge Questionnaire Score - 02/27/16 1336      Knowledge Questionnaire Score   Pre Score 23/24      Core Components/Risk Factors/Patient Goals at Admission:     Personal Goals and Risk Factors at Admission - 02/27/16 1610      Core Components/Risk Factors/Patient Goals on Admission    Weight Management Yes;Weight Loss;Weight Maintenance   Intervention Weight Management: Develop a combined nutrition and exercise program designed to reach desired caloric intake, while maintaining appropriate intake of nutrient and fiber, sodium and fats, and appropriate energy expenditure required for the weight goal.;Weight Management: Provide education and appropriate resources to help participant work on and attain dietary goals.;Weight Management/Obesity: Establish reasonable short term and long term weight goals.;Obesity: Provide education and appropriate resources to help participant work on and attain dietary goals.   Expected Outcomes Short Term: Continue to assess and modify interventions until short term weight is achieved;Weight Maintenance: Understanding of the daily nutrition guidelines, which includes 25-35% calories from fat, 7% or less cal from saturated fats, less than 200mg  cholesterol, less than 1.5gm of sodium, & 5 or more servings of fruits and vegetables daily;Weight Loss: Understanding of general recommendations for a balanced deficit meal plan, which promotes 1-2 lb weight loss per week and includes a negative energy balance of [phone] kcal/d;Understanding recommendations for meals to include 15-35% energy as protein, 25-35% energy from fat, 35-60% energy from carbohydrates, less than 200mg   of dietary cholesterol, 20-35 gm of total fiber daily;Understanding of distribution of  calorie intake throughout the day with the consumption of 4-5 meals/snacks;Weight Gain: Understanding of general recommendations for a high calorie, high protein meal plan that promotes weight gain by distributing calorie intake throughout the day with the consumption for 4-5 meals, snacks, and/or supplements   Sedentary Yes   Intervention Provide advice, education, support and counseling about physical activity/exercise needs.;Develop an individualized exercise prescription for aerobic and resistive training based on initial evaluation findings, risk stratification, comorbidities and participant's personal goals.   Expected Outcomes Achievement of increased cardiorespiratory fitness and enhanced flexibility, muscular endurance and strength shown through measurements of functional capacity and personal statement of participant.   Increase Strength and Stamina Yes   Intervention Provide advice, education, support and counseling about physical activity/exercise needs.;Develop an individualized exercise prescription for aerobic and resistive training based on initial evaluation findings, risk stratification, comorbidities and participant's personal goals.   Expected Outcomes Achievement of increased cardiorespiratory fitness and enhanced flexibility, muscular endurance and strength shown through measurements of functional capacity and personal statement of participant.   Hypertension Yes   Intervention Provide education on lifestyle modifcations including regular physical activity/exercise, weight management, moderate sodium restriction and increased consumption of fresh fruit, vegetables, and low fat dairy, alcohol moderation, and smoking cessation.;Monitor prescription use compliance.   Expected Outcomes Short Term: Continued assessment and intervention until BP is < 140/8mm HG in hypertensive participants. < 130/92mm HG in hypertensive participants with diabetes, heart failure or chronic kidney  disease.;Long Term: Maintenance of blood pressure at goal levels.   Lipids Yes   Intervention Provide education and support for participant on nutrition & aerobic/resistive exercise along with prescribed medications to achieve LDL 70mg , HDL >40mg .   Expected Outcomes Short Term: Participant states understanding of desired cholesterol values and is compliant with medications prescribed. Participant is following exercise prescription and nutrition guidelines.;Long Term: Cholesterol controlled with medications as prescribed, with individualized exercise RX and with personalized nutrition plan. Value goals: LDL < 70mg , HDL > 40 mg.   Stress Yes   Intervention Offer individual and/or small group education and counseling on adjustment to heart disease, stress management and health-related lifestyle change. Teach and support self-help strategies.;Refer participants experiencing significant psychosocial distress to appropriate mental health specialists for further evaluation and treatment. When possible, include family members and significant others in education/counseling sessions.   Expected Outcomes Short Term: Participant demonstrates changes in health-related behavior, relaxation and other stress management skills, ability to obtain effective social support, and compliance with psychotropic medications if prescribed.;Long Term: Emotional wellbeing is indicated by absence of clinically significant psychosocial distress or social isolation.   Personal Goal Other Yes   Personal Goal Return to running and lose 20lbs   Intervention Provide nutrition counseling and exercise programming to assist with improving cardiovascular fitness and weightloss   Expected Outcomes Pt will lose weight and get back to running      Core Components/Risk Factors/Patient Goals Review:    Core Components/Risk Factors/Patient Goals at Discharge (Final Review):    ITP Comments:     ITP Comments    Row Name 02/27/16 0922            ITP Comments Dr. Fransico Him, Medical Director          Comments:  Pt is making expected progress toward personal goals after completing 4 sessions. Pt is off to a good start.Psychosocial Assessment  Pt with good support at home. Pt interacts positively with  healthy coping skills. Generally feels good about her outlook. Recommend continued exercise and life style modification education including stress management and relaxation techniques to decrease cardiac risk profile. Cherre Huger, BSN

## 2016-03-15 ENCOUNTER — Encounter (HOSPITAL_COMMUNITY)
Admission: RE | Admit: 2016-03-15 | Discharge: 2016-03-15 | Disposition: A | Discharge status: Home / Self Care | Payer: Commercial Managed Care - HMO | Source: Ambulatory Visit | Attending: Cardiology | Admitting: Cardiology

## 2016-03-15 DIAGNOSIS — Z955 Presence of coronary angioplasty implant and graft: Secondary | ICD-10-CM | POA: Diagnosis not present

## 2016-03-15 DIAGNOSIS — E785 Hyperlipidemia, unspecified: Secondary | ICD-10-CM | POA: Diagnosis not present

## 2016-03-15 DIAGNOSIS — I10 Essential (primary) hypertension: Secondary | ICD-10-CM | POA: Insufficient documentation

## 2016-03-15 DIAGNOSIS — Z7982 Long term (current) use of aspirin: Secondary | ICD-10-CM | POA: Insufficient documentation

## 2016-03-15 DIAGNOSIS — E079 Disorder of thyroid, unspecified: Secondary | ICD-10-CM | POA: Diagnosis not present

## 2016-03-15 DIAGNOSIS — Z7902 Long term (current) use of antithrombotics/antiplatelets: Secondary | ICD-10-CM | POA: Diagnosis not present

## 2016-03-15 DIAGNOSIS — I2510 Atherosclerotic heart disease of native coronary artery without angina pectoris: Secondary | ICD-10-CM | POA: Diagnosis not present

## 2016-03-15 DIAGNOSIS — Z79899 Other long term (current) drug therapy: Secondary | ICD-10-CM | POA: Diagnosis not present

## 2016-03-18 ENCOUNTER — Encounter (HOSPITAL_COMMUNITY)
Admission: RE | Admit: 2016-03-18 | Discharge: 2016-03-18 | Disposition: A | Discharge status: Home / Self Care | Payer: Commercial Managed Care - HMO | Source: Ambulatory Visit | Attending: Cardiology | Admitting: Cardiology

## 2016-03-18 DIAGNOSIS — Z955 Presence of coronary angioplasty implant and graft: Secondary | ICD-10-CM

## 2016-03-18 NOTE — Progress Notes (Signed)
Deanna Solis 41 y.o. female Nutrition Note Spoke with pt.  Nutrition Survey reviewed with pt. Pt is following Step 2 of the Therapeutic Lifestyle Changes diet. Pt wants to lose wt. Pt and her husband are currently working toward wt loss together. Pt expressed understanding of the information reviewed. Pt aware of nutrition education classes offered and is unable to attend nutrition classes. No results found for: HGBA1C Wt Readings from Last 3 Encounters:  02/27/16 207 lb 0.2 oz (93.9 kg)  11/24/15 190 lb (86.2 kg)  04/01/07 219 lb (99.3 kg)   Nutrition Diagnosis ? Food-and nutrition-related knowledge deficit related to lack of exposure to information as related to diagnosis of: ? CVD  ? Obesity related to excessive energy intake as evidenced by a BMI of 32.5 Nutrition Intervention ? Benefits of adopting Therapeutic Lifestyle Changes discussed when [employer] reviewed. ? Pt to attend the Portion Distortion class ? Pt given handouts for: ? Nutrition I class ? Nutrition II class ? Continue client-centered nutrition education by RD, as part of interdisciplinary care.  Goal(s) ? Pt to identify food quantities necessary to achieve weight loss of 6-24 lb (2.7-10.9 kg) at graduation from cardiac rehab.   Monitor and Evaluate progress toward nutrition goal with team.  Derek Mound, M.Ed, RD, LDN, CDE 03/18/2016 8:18 AM

## 2016-03-20 ENCOUNTER — Encounter (HOSPITAL_COMMUNITY)
Admission: RE | Admit: 2016-03-20 | Discharge: 2016-03-20 | Disposition: A | Discharge status: Home / Self Care | Payer: Commercial Managed Care - HMO | Source: Ambulatory Visit | Attending: Cardiology | Admitting: Cardiology

## 2016-03-20 DIAGNOSIS — Z955 Presence of coronary angioplasty implant and graft: Secondary | ICD-10-CM

## 2016-03-22 ENCOUNTER — Encounter (HOSPITAL_COMMUNITY)
Admission: RE | Admit: 2016-03-22 | Discharge: 2016-03-22 | Disposition: A | Discharge status: Home / Self Care | Payer: Commercial Managed Care - HMO | Source: Ambulatory Visit | Attending: Cardiology | Admitting: Cardiology

## 2016-03-22 DIAGNOSIS — Z955 Presence of coronary angioplasty implant and graft: Secondary | ICD-10-CM | POA: Diagnosis not present

## 2016-03-25 ENCOUNTER — Encounter (HOSPITAL_COMMUNITY)
Admission: RE | Admit: 2016-03-25 | Discharge: 2016-03-25 | Disposition: A | Discharge status: Home / Self Care | Payer: Commercial Managed Care - HMO | Source: Ambulatory Visit | Attending: Cardiology | Admitting: Cardiology

## 2016-03-25 DIAGNOSIS — Z955 Presence of coronary angioplasty implant and graft: Secondary | ICD-10-CM

## 2016-03-27 ENCOUNTER — Encounter (HOSPITAL_COMMUNITY)
Admission: RE | Admit: 2016-03-27 | Discharge: 2016-03-27 | Disposition: A | Discharge status: Home / Self Care | Payer: Commercial Managed Care - HMO | Source: Ambulatory Visit | Attending: Cardiology | Admitting: Cardiology

## 2016-03-27 DIAGNOSIS — Z955 Presence of coronary angioplasty implant and graft: Secondary | ICD-10-CM | POA: Diagnosis not present

## 2016-03-29 ENCOUNTER — Encounter (HOSPITAL_COMMUNITY): Payer: Commercial Managed Care - HMO

## 2016-04-03 ENCOUNTER — Encounter (HOSPITAL_COMMUNITY)
Admission: RE | Admit: 2016-04-03 | Discharge: 2016-04-03 | Disposition: A | Discharge status: Home / Self Care | Payer: Commercial Managed Care - HMO | Source: Ambulatory Visit | Attending: Cardiology | Admitting: Cardiology

## 2016-04-03 DIAGNOSIS — Z955 Presence of coronary angioplasty implant and graft: Secondary | ICD-10-CM | POA: Diagnosis not present

## 2016-04-05 ENCOUNTER — Encounter (HOSPITAL_COMMUNITY)
Admission: RE | Admit: 2016-04-05 | Discharge: 2016-04-05 | Disposition: A | Discharge status: Home / Self Care | Payer: Commercial Managed Care - HMO | Source: Ambulatory Visit | Attending: Cardiology | Admitting: Cardiology

## 2016-04-05 DIAGNOSIS — Z955 Presence of coronary angioplasty implant and graft: Secondary | ICD-10-CM | POA: Diagnosis not present

## 2016-04-08 ENCOUNTER — Encounter (HOSPITAL_COMMUNITY)
Admission: RE | Admit: 2016-04-08 | Discharge: 2016-04-08 | Disposition: A | Discharge status: Home / Self Care | Payer: Commercial Managed Care - HMO | Source: Ambulatory Visit | Attending: Cardiology | Admitting: Cardiology

## 2016-04-08 DIAGNOSIS — Z955 Presence of coronary angioplasty implant and graft: Secondary | ICD-10-CM | POA: Diagnosis not present

## 2016-04-10 ENCOUNTER — Encounter (HOSPITAL_COMMUNITY)
Admission: RE | Admit: 2016-04-10 | Discharge: 2016-04-10 | Disposition: A | Discharge status: Home / Self Care | Payer: Commercial Managed Care - HMO | Source: Ambulatory Visit | Attending: Cardiology | Admitting: Cardiology

## 2016-04-10 DIAGNOSIS — Z955 Presence of coronary angioplasty implant and graft: Secondary | ICD-10-CM | POA: Diagnosis not present

## 2016-04-10 NOTE — Progress Notes (Signed)
Cardiac Individual Treatment Plan  Patient Details  Name: Deanna Solis MRN: [id number] Date of Birth: 03-Jan-1976 Referring Provider:   Flowsheet Row CARDIAC REHAB PHASE II ORIENTATION from 02/27/2016 in [place]  Referring Provider  Michell Heinrich MD and Vonna Drafts      Initial Encounter Date:  Flowsheet Row CARDIAC REHAB PHASE II ORIENTATION from 02/27/2016 in [facility]  Date  02/27/16  Referring Provider  Michell Heinrich MD and Vonna Drafts      Visit Diagnosis: 12/15/15 Status post coronary artery stent placement of the LAD  Patient's Home Medications on Admission:  Current Outpatient Prescriptions:  .  aspirin EC 81 MG tablet, Take 81 mg by mouth daily., Disp: , Rfl:  .  calcium carbonate (TUMS - DOSED IN MG ELEMENTAL CALCIUM) 500 MG chewable tablet, Chew 50 mg by mouth daily., Disp: , Rfl:  .  calcium carbonate (TUMS - DOSED IN MG ELEMENTAL CALCIUM) 500 MG chewable tablet, Chew 1 tablet by mouth daily., Disp: , Rfl:  .  folic acid (FOLVITE) 947 MCG tablet, Take 400 mcg by mouth daily., Disp: , Rfl:  .  hydrochlorothiazide (HYDRODIURIL) 25 MG tablet, Take 25 mg by mouth daily., Disp: , Rfl:  .  hydrochlorothiazide (HYDRODIURIL) 25 MG tablet, Take 25 mg by mouth daily., Disp: , Rfl:  .  ketoprofen (ORUDIS) 50 MG capsule, Take 50 mg by mouth every 6 (six) hours as needed (migraines)., Disp: , Rfl:  .  ketoprofen (ORUDIS) 75 MG capsule, Take 75-150 mg by mouth daily as needed for headache., Disp: , Rfl:  .  levothyroxine (SYNTHROID, LEVOTHROID) 150 MCG tablet, Take 150 mcg by mouth daily., Disp: , Rfl:  .  levothyroxine (SYNTHROID, LEVOTHROID) 150 MCG tablet, Take 150 mcg by mouth daily before breakfast., Disp: , Rfl:  .  loratadine (CLARITIN) 10 MG tablet, Take 10 mg by mouth daily., Disp: , Rfl:  .  metoprolol succinate (TOPROL-XL) 25 MG 24 hr tablet, Take 25 mg by mouth daily., Disp: , Rfl:  .   Multiple Vitamin (MULTIVITAMIN) tablet, Take 1 tablet by mouth daily., Disp: , Rfl:  .  Multiple Vitamins tablet, Take 1 tablet by mouth daily., Disp: , Rfl:  .  norethindrone (MICRONOR,CAMILA,ERRIN) 0.35 MG tablet, Take 1 tablet by mouth daily., Disp: , Rfl:  .  norethindrone (MICRONOR,CAMILA,ERRIN) 0.35 MG tablet, Take 1 tablet by mouth daily., Disp: , Rfl:  .  rosuvastatin (CRESTOR) 40 MG tablet, Take 40 mg by mouth every evening., Disp: , Rfl:  .  rosuvastatin (CRESTOR) 5 MG tablet, Take 5 mg by mouth every evening., Disp: , Rfl:  .  ticagrelor (BRILINTA) 90 MG TABS tablet, Take 90 mg by mouth 2 (two) times daily., Disp: , Rfl:   Past Medical History: Past Medical History:  Diagnosis Date  . Coronary artery disease   . Family history of malignant neoplasm of breast   . Hyperlipidemia   . Hypertension   . Migraines   . Ovarian cyst   . Submucous uterine fibroid   . Thyroid disease   . Thyroid disease    goiter    Tobacco Use: History  Smoking Status  . Never Smoker  Smokeless Tobacco  . Never Used    Labs: Recent Review Flowsheet Data    There is no flowsheet data to display.      Capillary Blood Glucose: No results found for: GLUCAP   Exercise Target Goals:    Exercise  Program Goal: Individual exercise prescription set with THRR, safety & activity barriers. Participant demonstrates ability to understand and report RPE using BORG scale, to self-measure pulse accurately, and to acknowledge the importance of the exercise prescription.  Exercise Prescription Goal: Starting with aerobic activity 30 plus minutes a day, 3 days per week for initial exercise prescription. Provide home exercise prescription and guidelines that participant acknowledges understanding prior to discharge.  Activity Barriers & Risk Stratification:     Activity Barriers & Cardiac Risk Stratification - 02/27/16 1405      Activity Barriers & Cardiac Risk Stratification   Activity Barriers  None   Cardiac Risk Stratification Moderate      6 Minute Walk:     6 Minute Walk    Row Name 02/27/16 1356 02/27/16 1401       6 Minute Walk   Phase Initial  -    Distance 1624 feet  -    Distance % Change 0 %  -    Walk Time 6 minutes  -    # of Rest Breaks 0  -    MPH  - 3.07    METS  - 4.77    RPE 7  -    VO2 Peak  - 16.7    Symptoms No  -    Resting HR 81 bpm  -    Resting BP 118/77  -    Max Ex. HR 113 bpm  -    Max Ex. BP 118/82  -    2 Minute Post BP 115/80  -       Oxygen Initial Assessment:   Oxygen Re-Evaluation:   Oxygen Discharge (Final Oxygen Re-Evaluation):   Initial Exercise Prescription:     Initial Exercise Prescription - 02/27/16 1400      Date of Initial Exercise RX and Referring Provider   Date 02/27/16   Referring Provider Michell Heinrich MD and Vonna Drafts     Treadmill   MPH 2.7   Grade 1   Minutes 10   METs 3.44     Bike   Level 1   Minutes 10   METs 2.99     NuStep   Level 3   Minutes 10   METs 2     Prescription Details   Frequency (times per week) 3   Duration Progress to 30 minutes of continuous aerobic without signs/symptoms of physical distress     Intensity   THRR 40-80% of Max Heartrate 72-143   Ratings of Perceived Exertion 11-13   Perceived Dyspnea 0-4     Progression   Progression Continue progressive overload as per policy without signs/symptoms or physical distress.     Resistance Training   Training Prescription Yes   Weight 3lbs   Reps 10-12      Perform Capillary Blood Glucose checks as needed.  Exercise Prescription Changes:      Exercise Prescription Changes    Row Name 04/08/16 0900             Response to Exercise   Blood Pressure (Admit) 110/78       Blood Pressure (Exercise) 148/82       Blood Pressure (Exit) 104/80       Heart Rate (Admit) 89 bpm       Heart Rate (Exercise) 136 bpm       Heart Rate (Exit) 86 bpm       Rating of Perceived Exertion (Exercise) 12  Duration Progress to 45 minutes of aerobic exercise without signs/symptoms of physical distress       Intensity THRR unchanged         Progression   Progression Continue to progress workloads to maintain intensity without signs/symptoms of physical distress.       Average METs 3.8         Resistance Training   Training Prescription Yes       Weight 3lbs       Reps 10-15         Treadmill   MPH 3       Grade 2       Minutes 10       METs 4.12         Bike   Level 1.8       Minutes 10       METs 4.59         NuStep   Level 4       Minutes 10       METs 2.6         Home Exercise Plan   Plans to continue exercise at Home (comment)       Frequency Add 3 additional days to program exercise sessions.          Exercise Comments:      Exercise Comments    Row Name 04/08/16 [phone]           Exercise Comments Reviewed METs and goals with pt. Pt is doing well with exercise.           Exercise Goals and Review:      Exercise Goals    Row Name 04/10/16 1631             Exercise Goals   Increase Physical Activity Yes       Intervention Provide advice, education, support and counseling about physical activity/exercise needs.;Develop an individualized exercise prescription for aerobic and resistive training based on initial evaluation findings, risk stratification, comorbidities and participant's personal goals.       Expected Outcomes Achievement of increased cardiorespiratory fitness and enhanced flexibility, muscular endurance and strength shown through measurements of functional capacity and personal statement of participant.       Increase Strength and Stamina Yes       Intervention Provide advice, education, support and counseling about physical activity/exercise needs.;Develop an individualized exercise prescription for aerobic and resistive training based on initial evaluation findings, risk stratification, comorbidities and participant's personal goals.        Expected Outcomes Achievement of increased cardiorespiratory fitness and enhanced flexibility, muscular endurance and strength shown through measurements of functional capacity and personal statement of participant.          Exercise Goals Re-Evaluation :     Exercise Goals Re-Evaluation    Row Name 04/10/16 1630             Exercise Goal Re-Evaluation   Exercise Goals Review Increase Physical Activity;Increase Strenth and Stamina       Comments Pt states that she is eating better but she hasn't lost much weight yet.  She is walking everyday outside of [place] and wants to begin jogging soon in [place] pending Dr. permission.       Expected Outcomes Continue with exercise routine in [place] and [facility], attend education classes in order to increase cardiorepiratory fitness and develop more knowledge about nutrition and exercise.  Discharge Exercise Prescription (Final Exercise Prescription Changes):     Exercise Prescription Changes - 04/08/16 0900      Response to Exercise   Blood Pressure (Admit) 110/78   Blood Pressure (Exercise) 148/82   Blood Pressure (Exit) 104/80   Heart Rate (Admit) 89 bpm   Heart Rate (Exercise) 136 bpm   Heart Rate (Exit) 86 bpm   Rating of Perceived Exertion (Exercise) 12   Duration Progress to 45 minutes of aerobic exercise without signs/symptoms of physical distress   Intensity THRR unchanged     Progression   Progression Continue to progress workloads to maintain intensity without signs/symptoms of physical distress.   Average METs 3.8     Resistance Training   Training Prescription Yes   Weight 3lbs   Reps 10-15     Treadmill   MPH 3   Grade 2   Minutes 10   METs 4.12     Bike   Level 1.8   Minutes 10   METs 4.59     NuStep   Level 4   Minutes 10   METs 2.6     Home Exercise Plan   Plans to continue exercise at Home (comment)   Frequency Add 3 additional days to program exercise sessions.      Nutrition:  Target Goals:  Understanding of nutrition guidelines, daily intake of sodium <1550m, cholesterol <209m calories 30% from fat and 7% or less from saturated fats, daily to have 5 or more servings of fruits and vegetables.  Biometrics:     Pre Biometrics - 02/27/16 1357      Pre Biometrics   Waist Circumference 36 inches   Hip Circumference 45 inches   Waist to Hip Ratio 0.8 %   Triceps Skinfold 36 mm   % Body Fat 40.6 %   Grip Strength 40 kg   Flexibility 14.5 in   Single Leg Stand 30 seconds       Nutrition Therapy Plan and Nutrition Goals:     Nutrition Therapy & Goals - 03/04/16 1217      Nutrition Therapy   Diet Therapeutic Lifestyle Changes     Personal Nutrition Goals   Nutrition Goal 1-2 lb wt loss/week to a wt loss goal of 6-24 lb [facility] regarding individualized specific dietary modifications aiming towards targeted core components such as weight, hypertension, lipid management, diabetes, heart failure and other comorbidities.   Expected Outcomes Short Term Goal: Understand basic principles of dietary content, such as calories, fat, sodium, cholesterol and nutrients.;Long Term Goal: Adherence to prescribed nutrition plan.      Nutrition Discharge: Nutrition Scores:     Nutrition Assessments - 03/18/16 0818      MEDFICTS Scores   Pre Score 18      Nutrition Goals Re-Evaluation:   Nutrition Goals Re-Evaluation:   Nutrition Goals Discharge (Final Nutrition Goals Re-Evaluation):   Psychosocial: Target Goals: Acknowledge presence or absence of significant depression and/or stress, maximize coping skills, provide positive support system. Participant is able to verbalize types and ability to use techniques and skills needed for reducing stress and depression.  Initial Review & Psychosocial Screening:     Initial Psych Review & Screening - 02[place]27/16 [place]   Comments brief psychosocial assessment reveals no barriers or further intervention needed at this time  Barriers   Psychosocial barriers to participate in program There are no identifiable barriers or psychosocial needs.     Screening Interventions   Interventions Encouraged to exercise      Quality of Life Scores:     Quality of Life - 02/27/16 1336      Quality of Life Scores   Health/Function Pre 24 %   Socioeconomic Pre 24.86 %   Psych/Spiritual Pre 28.29 %   Family Pre 22.5 %   GLOBAL Pre 24.91 %      PHQ-9: Recent Review Flowsheet Data    Depression screen [facility] 2/9 03/04/2016   Decreased Interest 0   Down, Depressed, Hopeless 0   PHQ - 2 Score 0     Interpretation of Total Score  Total Score Depression Severity:  1-4 = Minimal depression, 5-9 = Mild depression, 10-14 = Moderate depression, 15-19 = Moderately severe depression, 20-27 = Severe depression   Psychosocial Evaluation and Intervention:     Psychosocial Evaluation - 04/10/16 1647      Psychosocial Evaluation & Interventions   Interventions Encouraged to exercise with the program and follow exercise prescription;Stress management education;Relaxation education   Comments Pt with no identifiable barriers to cardiac rehab   Continue Psychosocial Services  No Follow up required      Psychosocial Re-Evaluation:     Psychosocial Re-Evaluation    [place] Name 03/04/16 0905 03/14/16 1439 04/10/16 1647         Psychosocial Re-Evaluation   Current issues with  -  - None Identified     Interventions Encouraged to attend Cardiac Rehabilitation for the exercise Encouraged to attend Cardiac Rehabilitation for the exercise;Relaxation education;Stress management education Encouraged to attend Cardiac Rehabilitation for the exercise     Continue Psychosocial Services   - No  -        Psychosocial Discharge (Final Psychosocial Re-Evaluation):     Psychosocial Re-Evaluation -  04/10/16 1647      Psychosocial Re-Evaluation   Current issues with None Identified   Interventions Encouraged to attend Cardiac Rehabilitation for the exercise      Vocational Rehabilitation: Provide vocational rehab assistance to qualifying candidates.   Vocational Rehab Evaluation & Intervention:     Vocational Rehab - 03/04/16 0915      Initial Vocational Rehab Evaluation & Intervention   Assessment shows need for Vocational Rehabilitation No      Education: Education Goals: Education classes will be provided on a weekly basis, covering required topics. Participant will state understanding/return demonstration of topics presented.  Learning Barriers/Preferences:     Learning Barriers/Preferences - 02/27/16 1406      Learning Barriers/Preferences   Learning Barriers Sight   Learning Preferences Video;Computer/Internet;Pictoral      Education Topics: Count Your Pulse:  -Group instruction provided by verbal instruction, demonstration, patient participation and written materials to support subject.  Instructors address importance of being able to find your pulse and how to count your pulse when at home without a heart monitor.  Patients get hands on experience counting their pulse with staff help and individually.   Heart Attack, Angina, and Risk Factor Modification:  -Group instruction provided by verbal instruction, video, and written materials to support subject.  Instructors address signs and symptoms of angina and heart attacks.    Also discuss risk factors for heart disease and how to make changes to improve heart health risk factors. Flowsheet Row CARDIAC REHAB PHASE II EXERCISE from 04/10/2016 in [place]  Date  03/06/16  Instruction Review Code  2- meets goals/outcomes      Functional Fitness:  -Group instruction provided by verbal instruction, demonstration, patient participation, and written materials to support subject.   Instructors address safety measures for doing things around the house.  Discuss how to get up and down off the floor, how to pick things up properly, how to safely get out of a chair without assistance, and balance training.   Meditation and Mindfulness:  -Group instruction provided by verbal instruction, patient participation, and written materials to support subject.  Instructor addresses importance of mindfulness and meditation practice to help reduce stress and improve awareness.  Instructor also leads participants through a meditation exercise.  Flowsheet Row CARDIAC REHAB PHASE II EXERCISE from 04/10/2016 in [place]  Date  04/03/16  Instruction Review Code  2- meets goals/outcomes      Stretching for Flexibility and Mobility:  -Group instruction provided by verbal instruction, patient participation, and written materials to support subject.  Instructors lead participants through series of stretches that are designed to increase flexibility thus improving mobility.  These stretches are additional exercise for major muscle groups that are typically performed during regular warm up and cool down. Flowsheet Row CARDIAC REHAB PHASE II EXERCISE from 04/10/2016 in [place]  Date  04/05/16  Instruction Review Code  2- meets goals/outcomes      Hands Only CPR Anytime:  -Group instruction provided by verbal instruction, video, patient participation and written materials to support subject.  Instructors co-teach with [facility] video for hands only CPR.  Participants get hands on experience with mannequins.   Nutrition I class: Heart Healthy Eating:  -Group instruction provided by PowerPoint slides, verbal discussion, and written materials to support subject matter. The instructor gives an explanation and review of the Therapeutic Lifestyle Changes diet recommendations, which includes a discussion on lipid goals, dietary fat, sodium,  fiber, plant stanol/sterol esters, sugar, and the components of a well-balanced, healthy diet. Flowsheet Row CARDIAC REHAB PHASE II EXERCISE from 04/10/2016 in [place]  Date  03/18/16  Educator  RD  Instruction Review Code  Not applicable [class handout given]      Nutrition II class: Lifestyle Skills:  -Group instruction provided by PowerPoint slides, verbal discussion, and written materials to support subject matter. The instructor gives an explanation and review of label reading, grocery shopping for heart health, heart healthy recipe modifications, and ways to make healthier choices when eating out. Flowsheet Row CARDIAC REHAB PHASE II EXERCISE from 04/10/2016 in [place]  Date  03/18/16  Educator  RD  Instruction Review Code  Not applicable [class handouts given]      Diabetes Question & Answer:  -Group instruction provided by PowerPoint slides, verbal discussion, and written materials to support subject matter. The instructor gives an explanation and review of diabetes co-morbidities, pre- and post-prandial blood glucose goals, pre-exercise blood glucose goals, signs, symptoms, and treatment of hypoglycemia and hyperglycemia, and foot care basics.   Diabetes Blitz:  -Group instruction provided by PowerPoint slides, verbal discussion, and written materials to support subject matter. The instructor gives an explanation and review of the physiology behind type 1 and type 2 diabetes, diabetes medications and rational behind using different medications, pre- and post-prandial blood glucose recommendations and Hemoglobin A1c goals, diabetes diet, and exercise including blood glucose guidelines for exercising safely.    Portion Distortion:  -Group instruction provided by PowerPoint  slides, verbal discussion, written materials, and food models to support subject matter. The instructor gives an explanation of serving size  versus portion size, changes in portions sizes over the last 20 years, and what consists of a serving from each food group. Flowsheet Row CARDIAC REHAB PHASE II EXERCISE from 04/10/2016 in [place]  Date  04/10/16  Educator  RD  Instruction Review Code  2- meets goals/outcomes      Stress Management:  -Group instruction provided by verbal instruction, video, and written materials to support subject matter.  Instructors review role of stress in heart disease and how to cope with stress positively.     Exercising on Your Own:  -Group instruction provided by verbal instruction, power point, and written materials to support subject.  Instructors discuss benefits of exercise, components of exercise, frequency and intensity of exercise, and end points for exercise.  Also discuss use of nitroglycerin and activating EMS.  Review options of places to exercise outside of rehab.  Review guidelines for sex with heart disease. Flowsheet Row CARDIAC REHAB PHASE II EXERCISE from 04/10/2016 in [place]  Date  03/13/16  Instruction Review Code  2- meets goals/outcomes      Cardiac Drugs I:  -Group instruction provided by verbal instruction and written materials to support subject.  Instructor reviews cardiac drug classes: antiplatelets, anticoagulants, beta blockers, and statins.  Instructor discusses reasons, side effects, and lifestyle considerations for each drug class. Flowsheet Row CARDIAC REHAB PHASE II EXERCISE from 04/10/2016 in [place]  Date  03/27/16  Instruction Review Code  2- meets goals/outcomes      Cardiac Drugs II:  -Group instruction provided by verbal instruction and written materials to support subject.  Instructor reviews cardiac drug classes: angiotensin converting enzyme inhibitors (ACE-I), angiotensin II receptor blockers (ARBs), nitrates, and calcium channel blockers.  Instructor  discusses reasons, side effects, and lifestyle considerations for each drug class.   Anatomy and Physiology of the Circulatory System:  -Group instruction provided by verbal instruction, video, and written materials to support subject.  Reviews functional anatomy of heart, how it relates to various diagnoses, and what role the heart plays in the overall system. Flowsheet Row CARDIAC REHAB PHASE II EXERCISE from 04/10/2016 in [place]  Date  03/20/16  Instruction Review Code  2- meets goals/outcomes      Knowledge Questionnaire Score:     Knowledge Questionnaire Score - 02/27/16 1336      Knowledge Questionnaire Score   Pre Score 23/24      Core Components/Risk Factors/Patient Goals at Admission:     Personal Goals and Risk Factors at Admission - 02/27/16 1610      Core Components/Risk Factors/Patient Goals on Admission    Weight Management Yes;Weight Loss;Weight Maintenance   Intervention Weight Management: Develop a combined nutrition and exercise program designed to reach desired caloric intake, while maintaining appropriate intake of nutrient and fiber, sodium and fats, and appropriate energy expenditure required for the weight goal.;Weight Management: Provide education and appropriate resources to help participant work on and attain dietary goals.;Weight Management/Obesity: Establish reasonable short term and long term weight goals.;Obesity: Provide education and appropriate resources to help participant work on and attain dietary goals.   Expected Outcomes Short Term: Continue to assess and modify interventions until short term weight is achieved;Weight Maintenance: Understanding of the daily nutrition guidelines, which includes 25-35% calories from fat, 7% or less cal  from saturated fats, less than 271m cholesterol, less than 1.5gm of sodium, & 5 or more servings of fruits and vegetables daily;Weight Loss: Understanding of general recommendations  for a balanced deficit meal plan, which promotes 1-2 lb weight loss per week and includes a negative energy balance of [phone] kcal/d;Understanding recommendations for meals to include 15-35% energy as protein, 25-35% energy from fat, 35-60% energy from carbohydrates, less than 2049mof dietary cholesterol, 20-35 gm of total fiber daily;Understanding of distribution of calorie intake throughout the day with the consumption of 4-5 meals/snacks;Weight Gain: Understanding of general recommendations for a high calorie, high protein meal plan that promotes weight gain by distributing calorie intake throughout the day with the consumption for 4-5 meals, snacks, and/or supplements   Sedentary Yes   Intervention Provide advice, education, support and counseling about physical activity/exercise needs.;Develop an individualized exercise prescription for aerobic and resistive training based on initial evaluation findings, risk stratification, comorbidities and participant's personal goals.   Expected Outcomes Achievement of increased cardiorespiratory fitness and enhanced flexibility, muscular endurance and strength shown through measurements of functional capacity and personal statement of participant.   Increase Strength and Stamina Yes   Intervention Provide advice, education, support and counseling about physical activity/exercise needs.;Develop an individualized exercise prescription for aerobic and resistive training based on initial evaluation findings, risk stratification, comorbidities and participant's personal goals.   Expected Outcomes Achievement of increased cardiorespiratory fitness and enhanced flexibility, muscular endurance and strength shown through measurements of functional capacity and personal statement of participant.   Hypertension Yes   Intervention Provide education on lifestyle modifcations including regular physical activity/exercise, weight management, moderate sodium restriction and  increased consumption of fresh fruit, vegetables, and low fat dairy, alcohol moderation, and smoking cessation.;Monitor prescription use compliance.   Expected Outcomes Short Term: Continued assessment and intervention until BP is < 140/9035mG in hypertensive participants. < 130/80m32m in hypertensive participants with diabetes, heart failure or chronic kidney disease.;Long Term: Maintenance of blood pressure at goal levels.   Lipids Yes   Intervention Provide education and support for participant on nutrition & aerobic/resistive exercise along with prescribed medications to achieve LDL <70mg76mL >40mg.37mxpected Outcomes Short Term: Participant states understanding of desired cholesterol values and is compliant with medications prescribed. Participant is following exercise prescription and nutrition guidelines.;Long Term: Cholesterol controlled with medications as prescribed, with individualized exercise RX and with personalized nutrition plan. Value goals: LDL < 70mg, 33m> 40 mg.   Stress Yes   Intervention Offer individual and/or small group education and counseling on adjustment to heart disease, stress management and health-related lifestyle change. Teach and support self-help strategies.;Refer participants experiencing significant psychosocial distress to appropriate mental health specialists for further evaluation and treatment. When possible, include family members and significant others in education/counseling sessions.   Expected Outcomes Short Term: Participant demonstrates changes in health-related behavior, relaxation and other stress management skills, ability to obtain effective social support, and compliance with psychotropic medications if prescribed.;Long Term: Emotional wellbeing is indicated by absence of clinically significant psychosocial distress or social isolation.   Personal Goal Other Yes   Personal Goal Return to running and lose 20lbs   Intervention Provide nutrition  counseling and exercise programming to assist with improving cardiovascular fitness and weightloss   Expected Outcomes Pt will lose weight and get back to running      Core Components/Risk Factors/Patient Goals Review:      Goals and Risk Factor Review    Row Name 04/08/16 [phone]  1630           Core Components/Risk Factors/Patient Goals Review   Personal Goals Review Weight Management/Obesity;Other  -      Review Pt states that she is eating better but she hasn't lost much weight yet.  She is walking everyday outside of [place] and wants to begin jogging soon in [place] pending Dr. permission. Pt states that she is eating better but she hasn't lost much weight yet.  She is walking everyday outside of [place] and wants to begin jogging soon in [place] pending Dr. permission.      Expected Outcomes Continue with exercise routine in [place] and [place], attend education classes in order to increase cardiorepiratory fitness and develop more knowledge about nutrition and exercise.   -         Core Components/Risk Factors/Patient Goals at Discharge (Final Review):      Goals and Risk Factor Review - 04/10/16 1630      Core Components/Risk Factors/Patient Goals Review   Review Pt states that she is eating better but she hasn't lost much weight yet.  She is walking everyday outside of [place] and wants to begin jogging soon in [place] pending Dr. permission.      ITP Comments:     ITP Comments    Row Name 02/27/16 [phone] 03/15/16 0917         ITP Comments Dr. Fransico Him, Medical Director Pt attended Hypertension education class on 03/15/16.  Pt met goals and outcomes         Comments:  Pt is making expected progress toward personal goals after completing 16 sessions. Psychosocial Assessment  Pt with good support at home. Pt took long weekend with her husband for Valentines day. Pt interacts positively with healthy coping skills. Generally feels good about her outlook. Recommend continued exercise and life style  modification education including stress management and relaxation techniques to decrease cardiac risk profile. Cherre Huger, BSN

## 2016-04-12 ENCOUNTER — Encounter (HOSPITAL_COMMUNITY)
Admission: RE | Admit: 2016-04-12 | Discharge: 2016-04-12 | Disposition: A | Discharge status: Home / Self Care | Payer: Commercial Managed Care - HMO | Source: Ambulatory Visit | Attending: Cardiology | Admitting: Cardiology

## 2016-04-12 DIAGNOSIS — Z7902 Long term (current) use of antithrombotics/antiplatelets: Secondary | ICD-10-CM | POA: Insufficient documentation

## 2016-04-12 DIAGNOSIS — Z79899 Other long term (current) drug therapy: Secondary | ICD-10-CM | POA: Insufficient documentation

## 2016-04-12 DIAGNOSIS — I2510 Atherosclerotic heart disease of native coronary artery without angina pectoris: Secondary | ICD-10-CM | POA: Insufficient documentation

## 2016-04-12 DIAGNOSIS — Z955 Presence of coronary angioplasty implant and graft: Secondary | ICD-10-CM | POA: Insufficient documentation

## 2016-04-12 DIAGNOSIS — E079 Disorder of thyroid, unspecified: Secondary | ICD-10-CM | POA: Insufficient documentation

## 2016-04-12 DIAGNOSIS — E785 Hyperlipidemia, unspecified: Secondary | ICD-10-CM | POA: Insufficient documentation

## 2016-04-12 DIAGNOSIS — I10 Essential (primary) hypertension: Secondary | ICD-10-CM | POA: Insufficient documentation

## 2016-04-12 DIAGNOSIS — Z7982 Long term (current) use of aspirin: Secondary | ICD-10-CM | POA: Insufficient documentation

## 2016-04-15 ENCOUNTER — Encounter (HOSPITAL_COMMUNITY)
Admission: RE | Admit: 2016-04-15 | Discharge: 2016-04-15 | Disposition: A | Discharge status: Home / Self Care | Payer: Commercial Managed Care - HMO | Source: Ambulatory Visit | Attending: Cardiology | Admitting: Cardiology

## 2016-04-15 DIAGNOSIS — Z955 Presence of coronary angioplasty implant and graft: Secondary | ICD-10-CM

## 2016-04-17 ENCOUNTER — Encounter (HOSPITAL_COMMUNITY)
Admission: RE | Admit: 2016-04-17 | Discharge: 2016-04-17 | Disposition: A | Discharge status: Home / Self Care | Payer: Commercial Managed Care - HMO | Source: Ambulatory Visit | Attending: Cardiology | Admitting: Cardiology

## 2016-04-17 DIAGNOSIS — Z955 Presence of coronary angioplasty implant and graft: Secondary | ICD-10-CM

## 2016-04-19 ENCOUNTER — Encounter (HOSPITAL_COMMUNITY)
Admission: RE | Admit: 2016-04-19 | Discharge: 2016-04-19 | Disposition: A | Discharge status: Home / Self Care | Payer: Commercial Managed Care - HMO | Source: Ambulatory Visit | Attending: Cardiology | Admitting: Cardiology

## 2016-04-19 DIAGNOSIS — Z955 Presence of coronary angioplasty implant and graft: Secondary | ICD-10-CM | POA: Diagnosis not present

## 2016-04-24 ENCOUNTER — Encounter (HOSPITAL_COMMUNITY)
Admission: RE | Admit: 2016-04-24 | Discharge: 2016-04-24 | Disposition: A | Discharge status: Home / Self Care | Payer: Commercial Managed Care - HMO | Source: Ambulatory Visit | Attending: Cardiology | Admitting: Cardiology

## 2016-04-24 DIAGNOSIS — Z955 Presence of coronary angioplasty implant and graft: Secondary | ICD-10-CM

## 2016-04-26 ENCOUNTER — Encounter (HOSPITAL_COMMUNITY)
Admission: RE | Admit: 2016-04-26 | Discharge: 2016-04-26 | Disposition: A | Discharge status: Home / Self Care | Payer: Commercial Managed Care - HMO | Source: Ambulatory Visit | Attending: Cardiology | Admitting: Cardiology

## 2016-04-26 DIAGNOSIS — Z955 Presence of coronary angioplasty implant and graft: Secondary | ICD-10-CM

## 2016-04-29 ENCOUNTER — Encounter (HOSPITAL_COMMUNITY)
Admission: RE | Admit: 2016-04-29 | Discharge: 2016-04-29 | Disposition: A | Discharge status: Home / Self Care | Payer: Commercial Managed Care - HMO | Source: Ambulatory Visit | Attending: Cardiology | Admitting: Cardiology

## 2016-04-29 DIAGNOSIS — Z955 Presence of coronary angioplasty implant and graft: Secondary | ICD-10-CM | POA: Diagnosis not present

## 2016-05-01 ENCOUNTER — Encounter (HOSPITAL_COMMUNITY)
Admission: RE | Admit: 2016-05-01 | Discharge: 2016-05-01 | Disposition: A | Discharge status: Home / Self Care | Payer: Commercial Managed Care - HMO | Source: Ambulatory Visit | Attending: Cardiology | Admitting: Cardiology

## 2016-05-01 DIAGNOSIS — Z955 Presence of coronary angioplasty implant and graft: Secondary | ICD-10-CM | POA: Diagnosis not present

## 2016-05-03 ENCOUNTER — Encounter (HOSPITAL_COMMUNITY)
Admission: RE | Admit: 2016-05-03 | Discharge: 2016-05-03 | Disposition: A | Discharge status: Home / Self Care | Payer: Commercial Managed Care - HMO | Source: Ambulatory Visit | Attending: Cardiology | Admitting: Cardiology

## 2016-05-03 DIAGNOSIS — Z955 Presence of coronary angioplasty implant and graft: Secondary | ICD-10-CM

## 2016-05-06 ENCOUNTER — Encounter (HOSPITAL_COMMUNITY)
Admission: RE | Admit: 2016-05-06 | Discharge: 2016-05-06 | Disposition: A | Discharge status: Home / Self Care | Payer: Commercial Managed Care - HMO | Source: Ambulatory Visit | Attending: Cardiology | Admitting: Cardiology

## 2016-05-06 DIAGNOSIS — Z955 Presence of coronary angioplasty implant and graft: Secondary | ICD-10-CM

## 2016-05-07 ENCOUNTER — Encounter (HOSPITAL_COMMUNITY): Payer: Self-pay

## 2016-05-07 NOTE — Progress Notes (Signed)
Cardiac Individual Treatment Plan  Patient Details  Name: Sarit Sparano MRN: [id number] Date of Birth: 11/13/1975 Referring Provider:     CARDIAC REHAB PHASE II ORIENTATION from 02/27/2016 in [place]  Referring Provider  Michell Heinrich MD and Vonna Drafts      Initial Encounter Date:    CARDIAC REHAB PHASE II ORIENTATION from 02/27/2016 in [place]  Date  02/27/16  Referring Provider  Michell Heinrich MD and Vonna Drafts      Visit Diagnosis: 12/15/15 Status post coronary artery stent placement of the LAD  Patient's Home Medications on Admission:  Current Outpatient Prescriptions:  .  aspirin EC 81 MG tablet, Take 81 mg by mouth daily., Disp: , Rfl:  .  calcium carbonate (TUMS - DOSED IN MG ELEMENTAL CALCIUM) 500 MG chewable tablet, Chew 50 mg by mouth daily., Disp: , Rfl:  .  calcium carbonate (TUMS - DOSED IN MG ELEMENTAL CALCIUM) 500 MG chewable tablet, Chew 1 tablet by mouth daily., Disp: , Rfl:  .  folic acid (FOLVITE) 425 MCG tablet, Take 400 mcg by mouth daily., Disp: , Rfl:  .  hydrochlorothiazide (HYDRODIURIL) 25 MG tablet, Take 25 mg by mouth daily., Disp: , Rfl:  .  hydrochlorothiazide (HYDRODIURIL) 25 MG tablet, Take 25 mg by mouth daily., Disp: , Rfl:  .  ketoprofen (ORUDIS) 50 MG capsule, Take 50 mg by mouth every 6 (six) hours as needed (migraines)., Disp: , Rfl:  .  ketoprofen (ORUDIS) 75 MG capsule, Take 75-150 mg by mouth daily as needed for headache., Disp: , Rfl:  .  levothyroxine (SYNTHROID, LEVOTHROID) 150 MCG tablet, Take 150 mcg by mouth daily., Disp: , Rfl:  .  levothyroxine (SYNTHROID, LEVOTHROID) 150 MCG tablet, Take 150 mcg by mouth daily before breakfast., Disp: , Rfl:  .  loratadine (CLARITIN) 10 MG tablet, Take 10 mg by mouth daily., Disp: , Rfl:  .  metoprolol succinate (TOPROL-XL) 25 MG 24 hr tablet, Take 25 mg by mouth daily., Disp: , Rfl:  .  Multiple Vitamin  (MULTIVITAMIN) tablet, Take 1 tablet by mouth daily., Disp: , Rfl:  .  Multiple Vitamins tablet, Take 1 tablet by mouth daily., Disp: , Rfl:  .  norethindrone (MICRONOR,CAMILA,ERRIN) 0.35 MG tablet, Take 1 tablet by mouth daily., Disp: , Rfl:  .  norethindrone (MICRONOR,CAMILA,ERRIN) 0.35 MG tablet, Take 1 tablet by mouth daily., Disp: , Rfl:  .  rosuvastatin (CRESTOR) 40 MG tablet, Take 40 mg by mouth every evening., Disp: , Rfl:  .  rosuvastatin (CRESTOR) 5 MG tablet, Take 5 mg by mouth every evening., Disp: , Rfl:  .  ticagrelor (BRILINTA) 90 MG TABS tablet, Take 90 mg by mouth 2 (two) times daily., Disp: , Rfl:   Past Medical History: Past Medical History:  Diagnosis Date  . Coronary artery disease   . Family history of malignant neoplasm of breast   . Hyperlipidemia   . Hypertension   . Migraines   . Ovarian cyst   . Submucous uterine fibroid   . Thyroid disease   . Thyroid disease    goiter    Tobacco Use: History  Smoking Status  . Never Smoker  Smokeless Tobacco  . Never Used    Labs: Recent Review Flowsheet Data    There is no flowsheet data to display.      Capillary Blood Glucose: No results found for: GLUCAP   Exercise Target Goals:    Exercise  Program Goal: Individual exercise prescription set with THRR, safety & activity barriers. Participant demonstrates ability to understand and report RPE using BORG scale, to self-measure pulse accurately, and to acknowledge the importance of the exercise prescription.  Exercise Prescription Goal: Starting with aerobic activity 30 plus minutes a day, 3 days per week for initial exercise prescription. Provide home exercise prescription and guidelines that participant acknowledges understanding prior to discharge.  Activity Barriers & Risk Stratification:     Activity Barriers & Cardiac Risk Stratification - 02/27/16 1405      Activity Barriers & Cardiac Risk Stratification   Activity Barriers None   Cardiac  Risk Stratification Moderate      6 Minute Walk:     6 Minute Walk    Row Name 02/27/16 1356 02/27/16 1401       6 Minute Walk   Phase Initial  -    Distance 1624 feet  -    Distance % Change 0 %  -    Walk Time 6 minutes  -    # of Rest Breaks 0  -    MPH  - 3.07    METS  - 4.77    RPE 7  -    VO2 Peak  - 16.7    Symptoms No  -    Resting HR 81 bpm  -    Resting BP 118/77  -    Max Ex. HR 113 bpm  -    Max Ex. BP 118/82  -    2 Minute Post BP 115/80  -       Oxygen Initial Assessment:   Oxygen Re-Evaluation:   Oxygen Discharge (Final Oxygen Re-Evaluation):   Initial Exercise Prescription:     Initial Exercise Prescription - 02/27/16 1400      Date of Initial Exercise RX and Referring Provider   Date 02/27/16   Referring Provider Michell Heinrich MD and Vonna Drafts     Treadmill   MPH 2.7   Grade 1   Minutes 10   METs 3.44     Bike   Level 1   Minutes 10   METs 2.99     NuStep   Level 3   Minutes 10   METs 2     Prescription Details   Frequency (times per week) 3   Duration Progress to 30 minutes of continuous aerobic without signs/symptoms of physical distress     Intensity   THRR 40-80% of Max Heartrate 72-143   Ratings of Perceived Exertion 11-13   Perceived Dyspnea 0-4     Progression   Progression Continue progressive overload as per policy without signs/symptoms or physical distress.     Resistance Training   Training Prescription Yes   Weight 3lbs   Reps 10-12      Perform Capillary Blood Glucose checks as needed.  Exercise Prescription Changes:     Exercise Prescription Changes    Row Name 04/08/16 0900 04/29/16 1600           Response to Exercise   Blood Pressure (Admit) 110/78 120/82      Blood Pressure (Exercise) 148/82 152/84      Blood Pressure (Exit) 104/80 106/62      Heart Rate (Admit) 89 bpm 93 bpm      Heart Rate (Exercise) 136 bpm 154 bpm      Heart Rate (Exit) 86 bpm 92 bpm      Rating of  Perceived Exertion (Exercise) 12 12  Duration Progress to 45 minutes of aerobic exercise without signs/symptoms of physical distress Progress to 45 minutes of aerobic exercise without signs/symptoms of physical distress      Intensity THRR unchanged THRR unchanged        Progression   Progression Continue to progress workloads to maintain intensity without signs/symptoms of physical distress. Continue to progress workloads to maintain intensity without signs/symptoms of physical distress.      Average METs 3.8 4.6        Resistance Training   Training Prescription Yes Yes      Weight 3lbs 5lb      Reps 10-15 10-15        Treadmill   MPH 3 4.2      Grade 2 1      Minutes 10 10      METs 4.12 4.8        Bike   Level 1.8 2      Minutes 10 10      METs 4.59 4.98        NuStep   Level 4 4      Minutes 10 10      METs 2.6 4.1        Home Exercise Plan   Plans to continue exercise at Home (comment) Home (comment)      Frequency Add 3 additional days to program exercise sessions. Add 3 additional days to program exercise sessions.         Exercise Comments:     Exercise Comments    Row Name 04/08/16 0914 05/01/16 1119         Exercise Comments Reviewed METs and goals with pt. Pt is doing well with exercise.  Reviewed METs and goals with pt. Pt is doing well with exercise.          Exercise Goals and Review:     Exercise Goals    Row Name 04/10/16 1631             Exercise Goals   Increase Physical Activity Yes       Intervention Provide advice, education, support and counseling about physical activity/exercise needs.;Develop an individualized exercise prescription for aerobic and resistive training based on initial evaluation findings, risk stratification, comorbidities and participant's personal goals.       Expected Outcomes Achievement of increased cardiorespiratory fitness and enhanced flexibility, muscular endurance and strength shown through measurements  of functional capacity and personal statement of participant.       Increase Strength and Stamina Yes       Intervention Provide advice, education, support and counseling about physical activity/exercise needs.;Develop an individualized exercise prescription for aerobic and resistive training based on initial evaluation findings, risk stratification, comorbidities and participant's personal goals.       Expected Outcomes Achievement of increased cardiorespiratory fitness and enhanced flexibility, muscular endurance and strength shown through measurements of functional capacity and personal statement of participant.          Exercise Goals Re-Evaluation :     Exercise Goals Re-Evaluation    Row Name 04/10/16 1630 05/01/16 1118           Exercise Goal Re-Evaluation   Exercise Goals Review Increase Physical Activity;Increase Strenth and Stamina Increase Physical Activity;Increase Strenth and Stamina      Comments Pt states that she is eating better but she hasn't lost much weight yet.  She is walking everyday outside of CR and wants to begin jogging soon in [place] pending  Dr. permission. Pt has started strength training in [place] and states she feels great!  She has more energy and feels stronger.       Expected Outcomes Continue with exercise routine in CR and HEP, attend education classes in order to increase cardiorepiratory fitness and develop more knowledge about nutrition and exercise.  Continue with exercise routine in CR and HEP, attend education classes in order to increase cardiorepiratory fitness and develop more knowledge about nutrition and exercise.           Discharge Exercise Prescription (Final Exercise Prescription Changes):     Exercise Prescription Changes - 04/29/16 1600      Response to Exercise   Blood Pressure (Admit) 120/82   Blood Pressure (Exercise) 152/84   Blood Pressure (Exit) 106/62   Heart Rate (Admit) 93 bpm   Heart Rate (Exercise) 154 bpm   Heart Rate (Exit)  92 bpm   Rating of Perceived Exertion (Exercise) 12   Duration Progress to 45 minutes of aerobic exercise without signs/symptoms of physical distress   Intensity THRR unchanged     Progression   Progression Continue to progress workloads to maintain intensity without signs/symptoms of physical distress.   Average METs 4.6     Resistance Training   Training Prescription Yes   Weight 5lb   Reps 10-15     Treadmill   MPH 4.2   Grade 1   Minutes 10   METs 4.8     Bike   Level 2   Minutes 10   METs 4.98     NuStep   Level 4   Minutes 10   METs 4.1     Home Exercise Plan   Plans to continue exercise at Home (comment)   Frequency Add 3 additional days to program exercise sessions.      Nutrition:  Target Goals: Understanding of nutrition guidelines, daily intake of sodium '1500mg'$ , cholesterol '200mg'$ , calories 30% from fat and 7% or less from saturated fats, daily to have 5 or more servings of fruits and vegetables.  Biometrics:     Pre Biometrics - 02/27/16 1357      Pre Biometrics   Waist Circumference 36 inches   Hip Circumference 45 inches   Waist to Hip Ratio 0.8 %   Triceps Skinfold 36 mm   % Body Fat 40.6 %   Grip Strength 40 kg   Flexibility 14.5 in   Single Leg Stand 30 seconds       Nutrition Therapy Plan and Nutrition Goals:     Nutrition Therapy & Goals - 03/04/16 1217      Nutrition Therapy   Diet Therapeutic Lifestyle Changes     Personal Nutrition Goals   Nutrition Goal 1-2 lb wt loss/week to a wt loss goal of 6-24 lb at graduation from [facility], educate and counsel regarding individualized specific dietary modifications aiming towards targeted core components such as weight, hypertension, lipid management, diabetes, heart failure and other comorbidities.   Expected Outcomes Short Term Goal: Understand basic principles of dietary content, such as calories, fat, sodium, cholesterol and  nutrients.;Long Term Goal: Adherence to prescribed nutrition plan.      Nutrition Discharge: Nutrition Scores:     Nutrition Assessments - 03/18/16 0818      MEDFICTS Scores   Pre Score 18      Nutrition Goals Re-Evaluation:   Nutrition Goals Re-Evaluation:   Nutrition Goals Discharge (Final Nutrition Goals  Re-Evaluation):   Psychosocial: Target Goals: Acknowledge presence or absence of significant depression and/or stress, maximize coping skills, provide positive support system. Participant is able to verbalize types and ability to use techniques and skills needed for reducing stress and depression.  Initial Review & Psychosocial Screening:     Initial Psych Review & Screening - 02/27/16 [place]? Yes   Comments brief psychosocial assessment reveals no barriers or further intervention needed at this time     Barriers   Psychosocial barriers to participate in program There are no identifiable barriers or psychosocial needs.     Screening Interventions   Interventions Encouraged to exercise      Quality of Life Scores:     Quality of Life - 02/27/16 1336      Quality of Life Scores   Health/Function Pre 24 %   Socioeconomic Pre 24.86 %   Psych/Spiritual Pre 28.29 %   Family Pre 22.5 %   GLOBAL Pre 24.91 %      PHQ-9: Recent Review Flowsheet Data    Depression screen [facility] 2/9 03/04/2016   Decreased Interest 0   Down, Depressed, Hopeless 0   PHQ - 2 Score 0     Interpretation of Total Score  Total Score Depression Severity:  1-4 = Minimal depression, 5-9 = Mild depression, 10-14 = Moderate depression, 15-19 = Moderately severe depression, 20-27 = Severe depression   Psychosocial Evaluation and Intervention:     Psychosocial Evaluation - 04/10/16 1647      Psychosocial Evaluation & Interventions   Interventions Encouraged to exercise with the program and follow exercise prescription;Stress management  education;Relaxation education   Comments Pt with no identifiable barriers to cardiac rehab   Continue Psychosocial Services  No Follow up required      Psychosocial Re-Evaluation:     Psychosocial Re-Evaluation    [facility] Name 03/04/16 0905 03/14/16 1439 04/10/16 1647 04/29/16 0752       Psychosocial Re-Evaluation   Current issues with  -  - None Identified Current Stress Concerns;Current Anxiety/Panic    Comments  -  -  - pt expressed she has noticed change in her disposition from health related stress and anxiety.  pt is having trouble coping with lifestyle changes associated with CAD.  pt offered emotional support and reassurance, as well as Jeanella Craze appointment.  pt is exercising on her own at home, however has not resumed her pre MI level of intensity.  pt is also discouraged she is not losing weight at a quicker pace.  pt encouraged to continue her current exercise and weight loss efforts.      Expected Outcomes  -  -  - pt will demonstrate positive coping skills about her recovery.  pt will continue exercise and nutritional changes that encourage weight loss with overall wellbeing.,    Interventions Encouraged to attend Cardiac Rehabilitation for the exercise Encouraged to attend Cardiac Rehabilitation for the exercise;Relaxation education;Stress management education Encouraged to attend Cardiac Rehabilitation for the exercise Stress management education;Encouraged to attend Cardiac Rehabilitation for the exercise;Relaxation education  Therapist referral if indicated    Continue Psychosocial Services   - No  - Follow up required by staff      Initial Review   Source of Stress Concerns  -  -  - Unable to participate in former interests or hobbies;Chronic Illness       Psychosocial Discharge (Final Psychosocial Re-Evaluation):  Psychosocial Re-Evaluation - 04/29/16 1856      Psychosocial Re-Evaluation   Current issues with Current Stress Concerns;Current Anxiety/Panic    Comments pt expressed she has noticed change in her disposition from health related stress and anxiety.  pt is having trouble coping with lifestyle changes associated with CAD.  pt offered emotional support and reassurance, as well as Jeanella Craze appointment.  pt is exercising on her own at home, however has not resumed her pre MI level of intensity.  pt is also discouraged she is not losing weight at a quicker pace.  pt encouraged to continue her current exercise and weight loss efforts.     Expected Outcomes pt will demonstrate positive coping skills about her recovery.  pt will continue exercise and nutritional changes that encourage weight loss with overall wellbeing.,   Interventions Stress management education;Encouraged to attend Cardiac Rehabilitation for the exercise;Relaxation education  Therapist referral if indicated   Continue Psychosocial Services  Follow up required by staff     Initial Review   Source of Stress Concerns Unable to participate in former interests or hobbies;Chronic Illness      Vocational Rehabilitation: Provide vocational rehab assistance to qualifying candidates.   Vocational Rehab Evaluation & Intervention:     Vocational Rehab - 03/04/16 0915      Initial Vocational Rehab Evaluation & Intervention   Assessment shows need for Vocational Rehabilitation No      Education: Education Goals: Education classes will be provided on a weekly basis, covering required topics. Participant will state understanding/return demonstration of topics presented.  Learning Barriers/Preferences:     Learning Barriers/Preferences - 02/27/16 1406      Learning Barriers/Preferences   Learning Barriers Sight   Learning Preferences Video;Computer/Internet;Pictoral      Education Topics: Count Your Pulse:  -Group instruction provided by verbal instruction, demonstration, patient participation and written materials to support subject.  Instructors address importance of  being able to find your pulse and how to count your pulse when at home without a heart monitor.  Patients get hands on experience counting their pulse with staff help and individually.   Heart Attack, Angina, and Risk Factor Modification:  -Group instruction provided by verbal instruction, video, and written materials to support subject.  Instructors address signs and symptoms of angina and heart attacks.    Also discuss risk factors for heart disease and how to make changes to improve heart health risk factors.   CARDIAC REHAB PHASE II EXERCISE from 05/03/2016 in [place]  Date  03/06/16  Instruction Review Code  2- meets goals/outcomes      Functional Fitness:  -Group instruction provided by verbal instruction, demonstration, patient participation, and written materials to support subject.  Instructors address safety measures for doing things around the house.  Discuss how to get up and down off the floor, how to pick things up properly, how to safely get out of a chair without assistance, and balance training.   CARDIAC REHAB PHASE II EXERCISE from 05/03/2016 in [place]  Date  04/26/16  Educator  Cleda Mccreedy, MS  Instruction Review Code  2- meets goals/outcomes      Meditation and Mindfulness:  -Group instruction provided by verbal instruction, patient participation, and written materials to support subject.  Instructor addresses importance of mindfulness and meditation practice to help reduce stress and improve awareness.  Instructor also leads participants through a meditation exercise.    CARDIAC REHAB PHASE II EXERCISE  from 05/03/2016 in [facility] CARDIAC REHAB  Date  04/03/16  Instruction Review Code  2- meets goals/outcomes      Stretching for Flexibility and Mobility:  -Group instruction provided by verbal instruction, patient participation, and written materials to support subject.   Instructors lead participants through series of stretches that are designed to increase flexibility thus improving mobility.  These stretches are additional exercise for major muscle groups that are typically performed during regular warm up and cool down.   CARDIAC REHAB PHASE II EXERCISE from 05/03/2016 in [facility] CARDIAC REHAB  Date  05/03/16  Instruction Review Code  2- meets goals/outcomes      Hands Only CPR Anytime:  -Group instruction provided by verbal instruction, video, patient participation and written materials to support subject.  Instructors co-teach with [facility] video for hands only CPR.  Participants get hands on experience with mannequins.   Nutrition I class: Heart Healthy Eating:  -Group instruction provided by PowerPoint slides, verbal discussion, and written materials to support subject matter. The instructor gives an explanation and review of the Therapeutic Lifestyle Changes diet recommendations, which includes a discussion on lipid goals, dietary fat, sodium, fiber, plant stanol/sterol esters, sugar, and the components of a well-balanced, healthy diet.   CARDIAC REHAB PHASE II EXERCISE from 05/03/2016 in [facility] CARDIAC REHAB  Date  03/18/16  Educator  RD  Instruction Review Code  Not applicable [class handout given]      Nutrition II class: Lifestyle Skills:  -Group instruction provided by PowerPoint slides, verbal discussion, and written materials to support subject matter. The instructor gives an explanation and review of label reading, grocery shopping for heart health, heart healthy recipe modifications, and ways to make healthier choices when eating out.   CARDIAC REHAB PHASE II EXERCISE from 05/03/2016 in [facility] CARDIAC REHAB  Date  03/18/16  Educator  RD  Instruction Review Code  Not applicable [class handouts given]      Diabetes Question & Answer:  -Group instruction provided by PowerPoint  slides, verbal discussion, and written materials to support subject matter. The instructor gives an explanation and review of diabetes co-morbidities, pre- and post-prandial blood glucose goals, pre-exercise blood glucose goals, signs, symptoms, and treatment of hypoglycemia and hyperglycemia, and foot care basics.   Diabetes Blitz:  -Group instruction provided by PowerPoint slides, verbal discussion, and written materials to support subject matter. The instructor gives an explanation and review of the physiology behind type 1 and type 2 diabetes, diabetes medications and rational behind using different medications, pre- and post-prandial blood glucose recommendations and Hemoglobin A1c goals, diabetes diet, and exercise including blood glucose guidelines for exercising safely.    Portion Distortion:  -Group instruction provided by PowerPoint slides, verbal discussion, written materials, and food models to support subject matter. The instructor gives an explanation of serving size versus portion size, changes in portions sizes over the last 20 years, and what consists of a serving from each food group.   CARDIAC REHAB PHASE II EXERCISE from 05/03/2016 in [facility] CARDIAC REHAB  Date  04/10/16  Educator  RD  Instruction Review Code  2- meets goals/outcomes      Stress Management:  -Group instruction provided by verbal instruction, video, and written materials to support subject matter.  Instructors review role of stress in heart disease and how to cope with stress positively.     CARDIAC REHAB PHASE II EXERCISE from 05/03/2016 in [facility] CARDIAC REHAB  Date  04/17/16  Instruction Review Code  2- meets goals/outcomes      Exercising on Your Own:  -Group instruction provided by verbal instruction, power point, and written materials to support subject.  Instructors discuss benefits of exercise, components of exercise, frequency and intensity of exercise,  and end points for exercise.  Also discuss use of nitroglycerin and activating EMS.  Review options of places to exercise outside of rehab.  Review guidelines for sex with heart disease.   CARDIAC REHAB PHASE II EXERCISE from 05/03/2016 in [facility] CARDIAC REHAB  Date  03/13/16  Instruction Review Code  2- meets goals/outcomes      Cardiac Drugs I:  -Group instruction provided by verbal instruction and written materials to support subject.  Instructor reviews cardiac drug classes: antiplatelets, anticoagulants, beta blockers, and statins.  Instructor discusses reasons, side effects, and lifestyle considerations for each drug class.   CARDIAC REHAB PHASE II EXERCISE from 05/03/2016 in [facility] CARDIAC REHAB  Date  03/27/16  Instruction Review Code  2- meets goals/outcomes      Cardiac Drugs II:  -Group instruction provided by verbal instruction and written materials to support subject.  Instructor reviews cardiac drug classes: angiotensin converting enzyme inhibitors (ACE-I), angiotensin II receptor blockers (ARBs), nitrates, and calcium channel blockers.  Instructor discusses reasons, side effects, and lifestyle considerations for each drug class.   CARDIAC REHAB PHASE II EXERCISE from 05/03/2016 in [facility] CARDIAC REHAB  Date  04/24/16  Educator  Annice Pih  Instruction Review Code  2- meets goals/outcomes      Anatomy and Physiology of the Circulatory System:  -Group instruction provided by verbal instruction, video, and written materials to support subject.  Reviews functional anatomy of heart, how it relates to various diagnoses, and what role the heart plays in the overall system.   CARDIAC REHAB PHASE II EXERCISE from 05/03/2016 in [facility] CARDIAC REHAB  Date  03/20/16  Instruction Review Code  2- meets goals/outcomes      Knowledge Questionnaire Score:     Knowledge Questionnaire Score - 02/27/16 1336       Knowledge Questionnaire Score   Pre Score 23/24      Core Components/Risk Factors/Patient Goals at Admission:     Personal Goals and Risk Factors at Admission - 04/29/16 0804      Core Components/Risk Factors/Patient Goals on Admission    Weight Management --   Intervention Weight Management: Provide education and appropriate resources to help participant work on and attain dietary goals.   Expected Outcomes Long Term: Adherence to nutrition and physical activity/exercise program aimed toward attainment of established weight goal;Weight Loss: Understanding of general recommendations for a balanced deficit meal plan, which promotes 1-2 lb weight loss per week and includes a negative energy balance of [phone] kcal/d   Hypertension --   Intervention --   Expected Outcomes --   Lipids --   Intervention --   Expected Outcomes --   Stress --   Intervention --   Expected Outcomes --      Core Components/Risk Factors/Patient Goals Review:      Goals and Risk Factor Review    Row Name 04/08/16 0914 04/10/16 1630 05/01/16 1119 05/07/16 1418       Core Components/Risk Factors/Patient Goals Review   Personal Goals Review Weight Management/Obesity;Other  - Weight Management/Obesity;Other Weight Management/Obesity;Other;Hypertension;Stress;Lipids    Review Pt states that she is eating better  but she hasn't lost much weight yet.  She is walking everyday outside of [place] and wants to begin jogging soon in [place] pending Dr. permission. Pt states that she is eating better but she hasn't lost much weight yet.  She is walking everyday outside of [place] and wants to begin jogging soon in [place] pending Dr. permission. Pt states she has a better understanding of nutrition and feels like she has control over her diet, however she has not seen any weight loss. Pt states she has a better understanding of nutrition and feels like she has control over her diet, however she has not seen any weight loss.    Expected  Outcomes Continue with exercise routine in [place] and [facility], attend education classes in order to increase cardiorepiratory fitness and develop more knowledge about nutrition and exercise.   - Continue with exercise routine in [place] and [facility], attend education classes in order to increase cardiorepiratory fitness and develop more knowledge about nutrition and exercise.  Continue with exercise routine in [place] and [facility], attend education classes in order to increase cardiorepiratory fitness and develop more knowledge about nutrition and exercise.        Core Components/Risk Factors/Patient Goals at Discharge (Final Review):      Goals and Risk Factor Review - 05/07/16 1418      Core Components/Risk Factors/Patient Goals Review   Personal Goals Review Weight Management/Obesity;Other;Hypertension;Stress;Lipids   Review Pt states she has a better understanding of nutrition and feels like she has control over her diet, however she has not seen any weight loss.   Expected Outcomes Continue with exercise routine in [place] and [facility], attend education classes in order to increase cardiorepiratory fitness and develop more knowledge about nutrition and exercise.       ITP Comments:     ITP Comments    Row Name 02/27/16 [phone] 03/15/16 0917         ITP Comments Dr. Fransico Him, Medical Director Pt attended Hypertension education class on 03/15/16.  Pt met goals and outcomes         Comments: Pt is making expected progress toward personal goals after completing 25 sessions. Recommend continued exercise and life style modification education including  stress management and relaxation techniques to decrease cardiac risk profile.

## 2016-05-08 ENCOUNTER — Encounter (HOSPITAL_COMMUNITY)
Admission: RE | Admit: 2016-05-08 | Discharge: 2016-05-08 | Disposition: A | Discharge status: Home / Self Care | Payer: Commercial Managed Care - HMO | Source: Ambulatory Visit | Attending: Cardiology | Admitting: Cardiology

## 2016-05-08 DIAGNOSIS — Z955 Presence of coronary angioplasty implant and graft: Secondary | ICD-10-CM

## 2016-05-10 ENCOUNTER — Encounter (HOSPITAL_COMMUNITY)
Admission: RE | Admit: 2016-05-10 | Discharge: 2016-05-10 | Disposition: A | Discharge status: Home / Self Care | Payer: Commercial Managed Care - HMO | Source: Ambulatory Visit | Attending: Cardiology | Admitting: Cardiology

## 2016-05-10 DIAGNOSIS — Z955 Presence of coronary angioplasty implant and graft: Secondary | ICD-10-CM | POA: Diagnosis not present

## 2016-05-13 ENCOUNTER — Encounter (HOSPITAL_COMMUNITY)
Admission: RE | Admit: 2016-05-13 | Discharge: 2016-05-13 | Disposition: A | Discharge status: Home / Self Care | Payer: Commercial Managed Care - HMO | Source: Ambulatory Visit | Attending: Cardiology | Admitting: Cardiology

## 2016-05-13 DIAGNOSIS — Z7982 Long term (current) use of aspirin: Secondary | ICD-10-CM | POA: Diagnosis not present

## 2016-05-13 DIAGNOSIS — Z955 Presence of coronary angioplasty implant and graft: Secondary | ICD-10-CM | POA: Diagnosis not present

## 2016-05-13 DIAGNOSIS — I10 Essential (primary) hypertension: Secondary | ICD-10-CM | POA: Diagnosis not present

## 2016-05-13 DIAGNOSIS — I2510 Atherosclerotic heart disease of native coronary artery without angina pectoris: Secondary | ICD-10-CM | POA: Diagnosis not present

## 2016-05-13 DIAGNOSIS — E785 Hyperlipidemia, unspecified: Secondary | ICD-10-CM | POA: Diagnosis not present

## 2016-05-13 DIAGNOSIS — Z7902 Long term (current) use of antithrombotics/antiplatelets: Secondary | ICD-10-CM | POA: Insufficient documentation

## 2016-05-13 DIAGNOSIS — Z79899 Other long term (current) drug therapy: Secondary | ICD-10-CM | POA: Diagnosis not present

## 2016-05-13 DIAGNOSIS — E079 Disorder of thyroid, unspecified: Secondary | ICD-10-CM | POA: Diagnosis not present

## 2016-05-15 ENCOUNTER — Encounter (HOSPITAL_COMMUNITY): Payer: Commercial Managed Care - HMO

## 2016-05-15 ENCOUNTER — Telehealth (HOSPITAL_COMMUNITY): Payer: Self-pay | Admitting: Cardiac Rehabilitation

## 2016-05-15 NOTE — Telephone Encounter (Signed)
pc received from pt she will be out sick from cardiac rehab today.

## 2016-05-16 ENCOUNTER — Telehealth (HOSPITAL_COMMUNITY): Payer: Self-pay | Admitting: *Deleted

## 2016-05-17 ENCOUNTER — Encounter (HOSPITAL_COMMUNITY): Admission: RE | Admit: 2016-05-17 | Payer: Commercial Managed Care - HMO | Source: Ambulatory Visit

## 2016-05-17 ENCOUNTER — Telehealth (HOSPITAL_COMMUNITY): Payer: Self-pay | Admitting: Cardiac Rehabilitation

## 2016-05-17 NOTE — Telephone Encounter (Signed)
Phone message received from pt she will be absent from cardiac rehab today due to bronchitis.

## 2016-05-20 ENCOUNTER — Encounter (HOSPITAL_COMMUNITY)
Admission: RE | Admit: 2016-05-20 | Discharge: 2016-05-20 | Disposition: A | Discharge status: Home / Self Care | Payer: Commercial Managed Care - HMO | Source: Ambulatory Visit | Attending: Cardiology | Admitting: Cardiology

## 2016-05-20 DIAGNOSIS — Z955 Presence of coronary angioplasty implant and graft: Secondary | ICD-10-CM | POA: Diagnosis not present

## 2016-05-22 ENCOUNTER — Encounter (HOSPITAL_COMMUNITY)
Admission: RE | Admit: 2016-05-22 | Discharge: 2016-05-22 | Disposition: A | Discharge status: Home / Self Care | Payer: Commercial Managed Care - HMO | Source: Ambulatory Visit | Attending: Cardiology | Admitting: Cardiology

## 2016-05-22 VITALS — Ht 67.0 in | Wt 204.1 lb

## 2016-05-22 DIAGNOSIS — Z955 Presence of coronary angioplasty implant and graft: Secondary | ICD-10-CM

## 2016-05-24 ENCOUNTER — Encounter (HOSPITAL_COMMUNITY)
Admission: RE | Admit: 2016-05-24 | Discharge: 2016-05-24 | Disposition: A | Discharge status: Home / Self Care | Payer: Commercial Managed Care - HMO | Source: Ambulatory Visit | Attending: Cardiology | Admitting: Cardiology

## 2016-05-24 DIAGNOSIS — Z955 Presence of coronary angioplasty implant and graft: Secondary | ICD-10-CM

## 2016-05-27 ENCOUNTER — Encounter (HOSPITAL_COMMUNITY)
Admission: RE | Admit: 2016-05-27 | Discharge: 2016-05-27 | Disposition: A | Discharge status: Home / Self Care | Payer: Commercial Managed Care - HMO | Source: Ambulatory Visit | Attending: Cardiology | Admitting: Cardiology

## 2016-05-27 DIAGNOSIS — Z955 Presence of coronary angioplasty implant and graft: Secondary | ICD-10-CM | POA: Diagnosis not present

## 2016-05-29 ENCOUNTER — Encounter (HOSPITAL_COMMUNITY)
Admission: RE | Admit: 2016-05-29 | Discharge: 2016-05-29 | Disposition: A | Discharge status: Home / Self Care | Payer: Commercial Managed Care - HMO | Source: Ambulatory Visit | Attending: Cardiology | Admitting: Cardiology

## 2016-05-29 DIAGNOSIS — Z955 Presence of coronary angioplasty implant and graft: Secondary | ICD-10-CM | POA: Diagnosis not present

## 2016-05-31 ENCOUNTER — Encounter (HOSPITAL_COMMUNITY)
Admission: RE | Admit: 2016-05-31 | Discharge: 2016-05-31 | Disposition: A | Discharge status: Home / Self Care | Payer: Commercial Managed Care - HMO | Source: Ambulatory Visit | Attending: Cardiology | Admitting: Cardiology

## 2016-05-31 DIAGNOSIS — Z955 Presence of coronary angioplasty implant and graft: Secondary | ICD-10-CM | POA: Diagnosis not present

## 2016-06-03 ENCOUNTER — Encounter (HOSPITAL_COMMUNITY): Payer: Self-pay

## 2016-06-03 ENCOUNTER — Encounter (HOSPITAL_COMMUNITY)
Admission: RE | Admit: 2016-06-03 | Discharge: 2016-06-03 | Disposition: A | Discharge status: Home / Self Care | Payer: Commercial Managed Care - HMO | Source: Ambulatory Visit | Attending: Cardiology | Admitting: Cardiology

## 2016-06-03 DIAGNOSIS — Z955 Presence of coronary angioplasty implant and graft: Secondary | ICD-10-CM

## 2016-06-03 NOTE — Progress Notes (Signed)
Cardiac Individual Treatment Plan  Patient Details  Name: Deanna Solis MRN: [id number] Date of Birth: 1975-07-25 Referring Provider:     CARDIAC REHAB PHASE II ORIENTATION from 02/27/2016 in [place]  Referring Provider  Michell Heinrich MD and Vonna Drafts      Initial Encounter Date:    CARDIAC REHAB PHASE II ORIENTATION from 02/27/2016 in [place]  Date  02/27/16  Referring Provider  Michell Heinrich MD and Vonna Drafts      Visit Diagnosis: 12/15/15 Status post coronary artery stent placement of the LAD  Patient's Home Medications on Admission:  Current Outpatient Prescriptions:  .  aspirin EC 81 MG tablet, Take 81 mg by mouth daily., Disp: , Rfl:  .  calcium carbonate (TUMS - DOSED IN MG ELEMENTAL CALCIUM) 500 MG chewable tablet, Chew 50 mg by mouth daily., Disp: , Rfl:  .  calcium carbonate (TUMS - DOSED IN MG ELEMENTAL CALCIUM) 500 MG chewable tablet, Chew 1 tablet by mouth daily., Disp: , Rfl:  .  hydrochlorothiazide (HYDRODIURIL) 25 MG tablet, Take 25 mg by mouth daily., Disp: , Rfl:  .  hydrochlorothiazide (HYDRODIURIL) 25 MG tablet, Take 25 mg by mouth daily., Disp: , Rfl:  .  levothyroxine (SYNTHROID, LEVOTHROID) 137 MCG tablet, Take 137 mcg by mouth daily before breakfast., Disp: , Rfl:  .  loratadine (CLARITIN) 10 MG tablet, Take 10 mg by mouth daily., Disp: , Rfl:  .  Multiple Vitamin (MULTIVITAMIN) tablet, Take 1 tablet by mouth daily., Disp: , Rfl:  .  Multiple Vitamins tablet, Take 1 tablet by mouth daily., Disp: , Rfl:  .  norethindrone (MICRONOR,CAMILA,ERRIN) 0.35 MG tablet, Take 1 tablet by mouth daily., Disp: , Rfl:  .  norethindrone (MICRONOR,CAMILA,ERRIN) 0.35 MG tablet, Take 1 tablet by mouth daily., Disp: , Rfl:  .  rosuvastatin (CRESTOR) 5 MG tablet, Take 5 mg by mouth every evening., Disp: , Rfl:  .  ticagrelor (BRILINTA) 90 MG TABS tablet, Take 90 mg by mouth 2 (two) times  daily., Disp: , Rfl:  .  folic acid (FOLVITE) 425 MCG tablet, Take 400 mcg by mouth daily., Disp: , Rfl:  .  ketoprofen (ORUDIS) 50 MG capsule, Take 50 mg by mouth every 6 (six) hours as needed (migraines)., Disp: , Rfl:  .  ketoprofen (ORUDIS) 75 MG capsule, Take 75-150 mg by mouth daily as needed for headache., Disp: , Rfl:  .  metoprolol succinate (TOPROL-XL) 25 MG 24 hr tablet, Take 25 mg by mouth daily., Disp: , Rfl:  .  rosuvastatin (CRESTOR) 40 MG tablet, Take 40 mg by mouth every evening., Disp: , Rfl:   Past Medical History: Past Medical History:  Diagnosis Date  . Coronary artery disease   . Family history of malignant neoplasm of breast   . Hyperlipidemia   . Hypertension   . Migraines   . Ovarian cyst   . Submucous uterine fibroid   . Thyroid disease   . Thyroid disease    goiter    Tobacco Use: History  Smoking Status  . Never Smoker  Smokeless Tobacco  . Never Used    Labs: Recent Review Flowsheet Data    There is no flowsheet data to display.      Capillary Blood Glucose: No results found for: GLUCAP   Exercise Target Goals:    Exercise Program Goal: Individual exercise prescription set with THRR, safety & activity barriers. Participant demonstrates ability to understand and  report RPE using BORG scale, to self-measure pulse accurately, and to acknowledge the importance of the exercise prescription.  Exercise Prescription Goal: Starting with aerobic activity 30 plus minutes a day, 3 days per week for initial exercise prescription. Provide home exercise prescription and guidelines that participant acknowledges understanding prior to discharge.  Activity Barriers & Risk Stratification:     Activity Barriers & Cardiac Risk Stratification - 02/27/16 1405      Activity Barriers & Cardiac Risk Stratification   Activity Barriers None   Cardiac Risk Stratification Moderate      6 Minute Walk:     6 Minute Walk    Row Name 02/27/16 1356 02/27/16  1401 05/22/16 1411     6 Minute Walk   Phase Initial  - Discharge   Distance 1624 feet  - 2120 feet   Distance % Change 0 %  - 30.5 %   Walk Time 6 minutes  - 6 minutes   # of Rest Breaks 0  - 0   MPH  - 3.07 4   METS  - 4.77 6.3   RPE 7  - 11   VO2 Peak  - 16.7 22   Symptoms No  - No   Resting HR 81 bpm  - 94 bpm   Resting BP 118/77  - 108/76   Max Ex. HR 113 bpm  - 145 bpm   Max Ex. BP 118/82  - 148/80   2 Minute Post BP 115/80  - 112/80      Oxygen Initial Assessment:   Oxygen Re-Evaluation:   Oxygen Discharge (Final Oxygen Re-Evaluation):   Initial Exercise Prescription:     Initial Exercise Prescription - 02/27/16 1400      Date of Initial Exercise RX and Referring Provider   Date 02/27/16   Referring Provider Michell Heinrich MD and Vonna Drafts     Treadmill   MPH 2.7   Grade 1   Minutes 10   METs 3.44     Bike   Level 1   Minutes 10   METs 2.99     NuStep   Level 3   Minutes 10   METs 2     Prescription Details   Frequency (times per week) 3   Duration Progress to 30 minutes of continuous aerobic without signs/symptoms of physical distress     Intensity   THRR 40-80% of Max Heartrate 72-143   Ratings of Perceived Exertion 11-13   Perceived Dyspnea 0-4     Progression   Progression Continue progressive overload as per policy without signs/symptoms or physical distress.     Resistance Training   Training Prescription Yes   Weight 3lbs   Reps 10-12      Perform Capillary Blood Glucose checks as needed.  Exercise Prescription Changes:      Exercise Prescription Changes    Row Name 04/08/16 0900 04/29/16 1600 05/27/16 1400 06/24/16 0800       Response to Exercise   Blood Pressure (Admit) 110/78 120/82 (P)  114/82 (P)  116/74    Blood Pressure (Exercise) 148/82 152/84 (P)  142/80 (P)  114/80    Blood Pressure (Exit) 104/80 106/62 (P)  98/74 (P)  124/80    Heart Rate (Admit) 89 bpm 93 bpm (P)  89 bpm (P)  99 bpm    Heart Rate  (Exercise) 136 bpm 154 bpm (P)  157 bpm (P)  168 bpm    Heart Rate (Exit) 86 bpm 92 bpm (  P)  93 bpm (P)  120 bpm    Rating of Perceived Exertion (Exercise) 12 12 (P)  13 (P)  13    Duration Progress to 45 minutes of aerobic exercise without signs/symptoms of physical distress Progress to 45 minutes of aerobic exercise without signs/symptoms of physical distress (P)  Progress to 45 minutes of aerobic exercise without signs/symptoms of physical distress (P)  Progress to 45 minutes of aerobic exercise without signs/symptoms of physical distress    Intensity THRR unchanged THRR unchanged (P)  THRR unchanged (P)  THRR unchanged      Progression   Progression Continue to progress workloads to maintain intensity without signs/symptoms of physical distress. Continue to progress workloads to maintain intensity without signs/symptoms of physical distress. (P)  Continue to progress workloads to maintain intensity without signs/symptoms of physical distress. (P)  Continue to progress workloads to maintain intensity without signs/symptoms of physical distress.    Average METs 3.8 4.6 (P)  4.6 (P)  4.7      Resistance Training   Training Prescription Yes Yes (P)  Yes (P)  Yes    Weight 3lbs 5lb (P)  5lb (P)  6lb    Reps 10-15 10-15 (P)  10-15 (P)  10-15      Treadmill   MPH 3 4.2 (P)  4.2 (P)  4.5    Grade 2 1 (P)  1 (P)  1    Minutes 10 10 (P)  10 (P)  10    METs 4.12 4.8 (P)  4.8 (P)  5.07      Bike   Level 1.8 2 (P)  2  -    Minutes 10 10 (P)  10  -    METs 4.59 4.98 (P)  4.98  -      NuStep   Level 4 4 (P)  4 (P)  7    Minutes 10 10 (P)  10 (P)  10    METs 2.6 4.1 (P)  4.1 (P)  4.3      Home Exercise Plan   Plans to continue exercise at Home (comment) Home (comment) (P)  Home (comment) (P)  Home (comment)    Frequency Add 3 additional days to program exercise sessions. Add 3 additional days to program exercise sessions. (P)  Add 3 additional days to program exercise sessions. (P)  Add 3  additional days to program exercise sessions.       Exercise Comments:      Exercise Comments    Row Name 04/08/16 0914 05/01/16 1119 05/29/16 0817       Exercise Comments Reviewed METs and goals with pt. Pt is doing well with exercise.  Reviewed METs and goals with pt. Pt is doing well with exercise.  Reviewed METs and goals with pt. Pt is doing well with exercise and will be graduating from the program next week.         Exercise Goals and Review:      Exercise Goals    Row Name 04/10/16 1631             Exercise Goals   Increase Physical Activity Yes       Intervention Provide advice, education, support and counseling about physical activity/exercise needs.;Develop an individualized exercise prescription for aerobic and resistive training based on initial evaluation findings, risk stratification, comorbidities and participant's personal goals.       Expected Outcomes Achievement of increased cardiorespiratory fitness and enhanced flexibility, muscular endurance and strength shown through  measurements of functional capacity and personal statement of participant.       Increase Strength and Stamina Yes       Intervention Provide advice, education, support and counseling about physical activity/exercise needs.;Develop an individualized exercise prescription for aerobic and resistive training based on initial evaluation findings, risk stratification, comorbidities and participant's personal goals.       Expected Outcomes Achievement of increased cardiorespiratory fitness and enhanced flexibility, muscular endurance and strength shown through measurements of functional capacity and personal statement of participant.          Exercise Goals Re-Evaluation :     Exercise Goals Re-Evaluation    Row Name 04/10/16 1630 05/01/16 1118 05/29/16 0814         Exercise Goal Re-Evaluation   Exercise Goals Review Increase Physical Activity;Increase Strenth and Stamina Increase Physical  Activity;Increase Strenth and Stamina Increase Physical Activity;Increase Strenth and Stamina     Comments Pt states that she is eating better but she hasn't lost much weight yet.  She is walking everyday outside of CR and wants to begin jogging soon in [facility] pending Dr. permission. Pt has started strength training in CR and states she feels great!  She has more energy and feels stronger.  pt is doing well with strength training and jogging in cardiac rehab.  I plan to show her some abdominal exercises this Friday for her to work on at home after she graduated next Monday.  She plans to continue her workout routine at the gym at her job and she is going to join a walk/run group at the beginning of next month.      Expected Outcomes Continue with exercise routine in CR and HEP, attend education classes in order to increase cardiorepiratory fitness and develop more knowledge about nutrition and exercise.  Continue with exercise routine in CR and HEP, attend education classes in order to increase cardiorepiratory fitness and develop more knowledge about nutrition and exercise.  Continue with home exercicse program and increasing workloads as tolerated in order to continue to increase cardiorespiratory fitness.           Discharge Exercise Prescription (Final Exercise Prescription Changes):     Exercise Prescription Changes - 06/24/16 0800      Response to Exercise   Blood Pressure (Admit) (P)  116/74   Blood Pressure (Exercise) (P)  114/80   Blood Pressure (Exit) (P)  124/80   Heart Rate (Admit) (P)  99 bpm   Heart Rate (Exercise) (P)  168 bpm   Heart Rate (Exit) (P)  120 bpm   Rating of Perceived Exertion (Exercise) (P)  13   Duration (P)  Progress to 45 minutes of aerobic exercise without signs/symptoms of physical distress   Intensity (P)  THRR unchanged     Progression   Progression (P)  Continue to progress workloads to maintain intensity without signs/symptoms of physical distress.   Average  METs (P)  4.7     Resistance Training   Training Prescription (P)  Yes   Weight (P)  6lb   Reps (P)  10-15     Treadmill   MPH (P)  4.5   Grade (P)  1   Minutes (P)  10   METs (P)  5.07     NuStep   Level (P)  7   Minutes (P)  10   METs (P)  4.3     Home Exercise Plan   Plans to continue exercise at (P)  Home (comment)  Frequency (P)  Add 3 additional days to program exercise sessions.      Nutrition:  Target Goals: Understanding of nutrition guidelines, daily intake of sodium '1500mg'$ , cholesterol '200mg'$ , calories 30% from fat and 7% or less from saturated fats, daily to have 5 or more servings of fruits and vegetables.  Biometrics:     Pre Biometrics - 02/27/16 1357      Pre Biometrics   Waist Circumference 36 inches   Hip Circumference 45 inches   Waist to Hip Ratio 0.8 %   Triceps Skinfold 36 mm   % Body Fat 40.6 %   Grip Strength 40 kg   Flexibility 14.5 in   Single Leg Stand 30 seconds         Post Biometrics - 05/22/16 1412       Post  Biometrics   Height '5\' 7"'$  (1.702 m)   Weight 204 lb 2.3 oz (92.6 kg)   Waist Circumference 36.5 inches   Hip Circumference 46 inches   Waist to Hip Ratio 0.79 %   BMI (Calculated) 32   Triceps Skinfold 38 mm   % Body Fat 40.8 %   Grip Strength 36 kg   Flexibility 16 in   Single Leg Stand 30 seconds      Nutrition Therapy Plan and Nutrition Goals:     Nutrition Therapy & Goals - 03/04/16 1217      Nutrition Therapy   Diet Therapeutic Lifestyle Changes     Personal Nutrition Goals   Nutrition Goal 1-2 lb wt loss/week to a wt loss goal of 6-24 lb at graduation from [facility], educate and counsel regarding individualized specific dietary modifications aiming towards targeted core components such as weight, hypertension, lipid management, diabetes, heart failure and other comorbidities.   Expected Outcomes Short Term Goal: Understand basic principles of  dietary content, such as calories, fat, sodium, cholesterol and nutrients.;Long Term Goal: Adherence to prescribed nutrition plan.      Nutrition Discharge: Nutrition Scores:     Nutrition Assessments - 06/25/16 1346      MEDFICTS Scores   Pre Score 18   Post Score 30   Score Difference 12      Nutrition Goals Re-Evaluation:     Nutrition Goals Re-Evaluation    Row Name 06/25/16 1347             Goals   Current Weight 203 lb (92.1 kg)       Nutrition Goal 1-2 lb wt loss/week to a wt loss goal of 6-24 lb at graduation from [facility] Pt has lost 3.5 lb. Wt loss goal not met.           Nutrition Goals Re-Evaluation:     Nutrition Goals Re-Evaluation    [place] Name 06/25/16 1347             Goals   Current Weight 203 lb (92.1 kg)       Nutrition Goal 1-2 lb wt loss/week to a wt loss goal of 6-24 lb at graduation from [facility] Pt has lost 3.5 lb. Wt loss goal not met.           Nutrition Goals Discharge (Final Nutrition Goals Re-Evaluation):     Nutrition Goals Re-Evaluation - 06/25/16 1347      Goals   Current Weight 203 lb (92.1 kg)  Nutrition Goal 1-2 lb wt loss/week to a wt loss goal of 6-24 lb at graduation from [place] Pt has lost 3.5 lb. Wt loss goal not met.       Psychosocial: Target Goals: Acknowledge presence or absence of significant depression and/or stress, maximize coping skills, provide positive support system. Participant is able to verbalize types and ability to use techniques and skills needed for reducing stress and depression.  Initial Review & Psychosocial Screening:     Initial Psych Review & Screening - 02/27/16 [place]? Yes   Comments brief psychosocial assessment reveals no barriers or further intervention needed at this time     Barriers   Psychosocial barriers to participate in program There are no identifiable barriers or  psychosocial needs.     Screening Interventions   Interventions Encouraged to exercise      Quality of Life Scores:     Quality of Life - 06/24/16 0924      Quality of Life Scores   Health/Function Pre (P)  24 %   Health/Function Post (P)  23.93 %   Health/Function % Change (P)  -0.29 %   Socioeconomic Pre (P)  24.86 %   Socioeconomic Post (P)  22.86 %   Socioeconomic % Change  (P)  -8.05 %   Psych/Spiritual Pre (P)  28.29 %   Psych/Spiritual Post (P)  22.29 %   Psych/Spiritual % Change (P)  -21.21 %   Family Pre (P)  22.5 %   Family Post (P)  23.88 %   Family % Change (P)  6.13 %   GLOBAL Pre (P)  24.91 %   GLOBAL Post (P)  23.24 %   GLOBAL % Change (P)  -6.7 %      PHQ-9: Recent Review Flowsheet Data    Depression screen [facility] 2/9 06/03/2016 03/04/2016   Decreased Interest 0 0   Down, Depressed, Hopeless 0 0   PHQ - 2 Score 0 0     Interpretation of Total Score  Total Score Depression Severity:  1-4 = Minimal depression, 5-9 = Mild depression, 10-14 = Moderate depression, 15-19 = Moderately severe depression, 20-27 = Severe depression   Psychosocial Evaluation and Intervention:     Psychosocial Evaluation - 06/03/16 1710      Discharge Psychosocial Assessment & Intervention   Comments no psychosocial needs identified, no interventions necessary       Psychosocial Re-Evaluation:     Psychosocial Re-Evaluation    Row Name 03/04/16 0905 03/14/16 1439 04/10/16 1647 04/29/16 0752 05/31/16 0749     Psychosocial Re-Evaluation   Current issues with  -  - None Identified Current Stress Concerns;Current Anxiety/Panic  -   Comments  -  -  - pt expressed she has noticed change in her disposition from health related stress and anxiety.  pt is having trouble coping with lifestyle changes associated with CAD.  pt offered emotional support and reassurance, as well as Jeanella Craze appointment.  pt is exercising on her own at home, however has not resumed her pre MI level of  intensity.  pt is also discouraged she is not losing weight at a quicker pace.  pt encouraged to continue her current exercise and weight loss efforts.   pt exhibits decreased health related stress and anxiety.  pt is able to return to previous activities which has given her reassuranc.e.     Expected Outcomes  -  -  -  pt will demonstrate positive coping skills about her recovery.  pt will continue exercise and nutritional changes that encourage weight loss with overall wellbeing., pt will demonstrate positive coping skills about her recovery.  pt will continue exercise and nutritional changes that encourage weight loss with overall wellbeing.,   Interventions Encouraged to attend Cardiac Rehabilitation for the exercise Encouraged to attend Cardiac Rehabilitation for the exercise;Relaxation education;Stress management education Encouraged to attend Cardiac Rehabilitation for the exercise Stress management education;Encouraged to attend Cardiac Rehabilitation for the exercise;Relaxation education  Therapist referral if indicated Stress management education;Encouraged to attend Cardiac Rehabilitation for the exercise;Relaxation education   Continue Psychosocial Services   - No  - Follow up required by staff Follow up required by staff     Initial Review   Source of Stress Concerns  -  -  - Unable to participate in former interests or hobbies;Chronic Illness Unable to participate in former interests or hobbies;Chronic Illness      Psychosocial Discharge (Final Psychosocial Re-Evaluation):     Psychosocial Re-Evaluation - 05/31/16 0749      Psychosocial Re-Evaluation   Comments pt exhibits decreased health related stress and anxiety.  pt is able to return to previous activities which has given her reassuranc.e.     Expected Outcomes pt will demonstrate positive coping skills about her recovery.  pt will continue exercise and nutritional changes that encourage weight loss with overall wellbeing.,    Interventions Stress management education;Encouraged to attend Cardiac Rehabilitation for the exercise;Relaxation education   Continue Psychosocial Services  Follow up required by staff     Initial Review   Source of Stress Concerns Unable to participate in former interests or hobbies;Chronic Illness      Vocational Rehabilitation: Provide vocational rehab assistance to qualifying candidates.   Vocational Rehab Evaluation & Intervention:     Vocational Rehab - 03/04/16 0915      Initial Vocational Rehab Evaluation & Intervention   Assessment shows need for Vocational Rehabilitation No      Education: Education Goals: Education classes will be provided on a weekly basis, covering required topics. Participant will state understanding/return demonstration of topics presented.  Learning Barriers/Preferences:     Learning Barriers/Preferences - 02/27/16 1406      Learning Barriers/Preferences   Learning Barriers Sight   Learning Preferences Video;Computer/Internet;Pictoral      Education Topics: Count Your Pulse:  -Group instruction provided by verbal instruction, demonstration, patient participation and written materials to support subject.  Instructors address importance of being able to find your pulse and how to count your pulse when at home without a heart monitor.  Patients get hands on experience counting their pulse with staff help and individually.   Heart Attack, Angina, and Risk Factor Modification:  -Group instruction provided by verbal instruction, video, and written materials to support subject.  Instructors address signs and symptoms of angina and heart attacks.    Also discuss risk factors for heart disease and how to make changes to improve heart health risk factors.   CARDIAC REHAB PHASE II EXERCISE from 05/08/2016 in [facility]  Date  05/08/16  Instruction Review Code  2- meets goals/outcomes      Functional Fitness:   -Group instruction provided by verbal instruction, demonstration, patient participation, and written materials to support subject.  Instructors address safety measures for doing things around the house.  Discuss how to get up and down off the floor, how to pick things up properly, how to safely get  out of a chair without assistance, and balance training.   CARDIAC REHAB PHASE II EXERCISE from 05/08/2016 in [facility] CARDIAC REHAB  Date  04/26/16  Educator  Rosine Door, MS  Instruction Review Code  2- meets goals/outcomes      Meditation and Mindfulness:  -Group instruction provided by verbal instruction, patient participation, and written materials to support subject.  Instructor addresses importance of mindfulness and meditation practice to help reduce stress and improve awareness.  Instructor also leads participants through a meditation exercise.    CARDIAC REHAB PHASE II EXERCISE from 05/08/2016 in [facility] CARDIAC REHAB  Date  04/03/16  Instruction Review Code  2- meets goals/outcomes      Stretching for Flexibility and Mobility:  -Group instruction provided by verbal instruction, patient participation, and written materials to support subject.  Instructors lead participants through series of stretches that are designed to increase flexibility thus improving mobility.  These stretches are additional exercise for major muscle groups that are typically performed during regular warm up and cool down.   CARDIAC REHAB PHASE II EXERCISE from 05/08/2016 in [facility] CARDIAC REHAB  Date  05/03/16  Instruction Review Code  2- meets goals/outcomes      Hands Only CPR Anytime:  -Group instruction provided by verbal instruction, video, patient participation and written materials to support subject.  Instructors co-teach with [facility] video for hands only CPR.  Participants get hands on experience with mannequins.   Nutrition I class: Heart  Healthy Eating:  -Group instruction provided by PowerPoint slides, verbal discussion, and written materials to support subject matter. The instructor gives an explanation and review of the Therapeutic Lifestyle Changes diet recommendations, which includes a discussion on lipid goals, dietary fat, sodium, fiber, plant stanol/sterol esters, sugar, and the components of a well-balanced, healthy diet.   CARDIAC REHAB PHASE II EXERCISE from 05/08/2016 in [facility] CARDIAC REHAB  Date  03/18/16  Educator  RD  Instruction Review Code  Not applicable [class handout given]      Nutrition II class: Lifestyle Skills:  -Group instruction provided by PowerPoint slides, verbal discussion, and written materials to support subject matter. The instructor gives an explanation and review of label reading, grocery shopping for heart health, heart healthy recipe modifications, and ways to make healthier choices when eating out.   CARDIAC REHAB PHASE II EXERCISE from 05/08/2016 in [facility] CARDIAC REHAB  Date  03/18/16  Educator  RD  Instruction Review Code  Not applicable [class handouts given]      Diabetes Question & Answer:  -Group instruction provided by PowerPoint slides, verbal discussion, and written materials to support subject matter. The instructor gives an explanation and review of diabetes co-morbidities, pre- and post-prandial blood glucose goals, pre-exercise blood glucose goals, signs, symptoms, and treatment of hypoglycemia and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

## 2016-06-05 ENCOUNTER — Encounter (HOSPITAL_COMMUNITY): Payer: Commercial Managed Care - HMO

## 2016-06-07 ENCOUNTER — Encounter (HOSPITAL_COMMUNITY): Payer: Commercial Managed Care - HMO

## 2016-06-12 ENCOUNTER — Encounter (HOSPITAL_COMMUNITY): Payer: Commercial Managed Care - HMO

## 2016-06-14 ENCOUNTER — Encounter (HOSPITAL_COMMUNITY): Payer: Commercial Managed Care - HMO

## 2016-06-19 ENCOUNTER — Encounter (HOSPITAL_COMMUNITY): Payer: Commercial Managed Care - HMO

## 2016-06-21 ENCOUNTER — Encounter (HOSPITAL_COMMUNITY): Payer: Commercial Managed Care - HMO

## 2016-06-26 ENCOUNTER — Encounter (HOSPITAL_COMMUNITY): Payer: Commercial Managed Care - HMO

## 2016-06-28 ENCOUNTER — Encounter (HOSPITAL_COMMUNITY): Payer: Commercial Managed Care - HMO

## 2016-12-26 ENCOUNTER — Ambulatory Visit (HOSPITAL_COMMUNITY)
Admission: RE | Admit: 2016-12-26 | Discharge: 2016-12-26 | Disposition: A | Discharge status: Home / Self Care | Payer: 59 | Source: Ambulatory Visit | Attending: Obstetrics and Gynecology | Admitting: Obstetrics and Gynecology

## 2016-12-26 ENCOUNTER — Other Ambulatory Visit (HOSPITAL_COMMUNITY): Payer: Self-pay | Admitting: Obstetrics and Gynecology

## 2016-12-26 ENCOUNTER — Encounter (HOSPITAL_COMMUNITY): Payer: Self-pay

## 2016-12-26 DIAGNOSIS — Z3169 Encounter for other general counseling and advice on procreation: Secondary | ICD-10-CM | POA: Insufficient documentation

## 2016-12-26 DIAGNOSIS — N979 Female infertility, unspecified: Secondary | ICD-10-CM

## 2016-12-26 DIAGNOSIS — E039 Hypothyroidism, unspecified: Secondary | ICD-10-CM | POA: Insufficient documentation

## 2016-12-26 DIAGNOSIS — I252 Old myocardial infarction: Secondary | ICD-10-CM | POA: Diagnosis present

## 2016-12-26 DIAGNOSIS — I2510 Atherosclerotic heart disease of native coronary artery without angina pectoris: Secondary | ICD-10-CM | POA: Diagnosis not present

## 2016-12-26 DIAGNOSIS — Z7189 Other specified counseling: Secondary | ICD-10-CM | POA: Diagnosis not present

## 2016-12-26 NOTE — ED Notes (Signed)
Here today in [facility] for preconception consult with Dr. Margurite Auerbach.  VS, BP 119 75 HR 70.  LMP 12-20-16.

## 2016-12-26 NOTE — Consult Note (Addendum)
MFM Consult Staff Note  I spoke to your patient about her history of myocardial infarction my impression of what is needed to manage her desired pregnancy.  Currently, she is followed by cardiology with no evidence of valvular insufficiency/stenosis, no pulmonary HTN, and has a normal ejection fraction.Given her desire to conceive pregnancy she was told to initiate prenatal vitamins and continue the baby aspirin.  She should remain on these in the preconception period as well as throughout the whole of her pregnancy (no discontinuation of baby ASA even after 37 weeks given her stent).  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  Her amlodipine is a safe medication to take for heart rate control and BP control.  A beta blocker like metoprolol or labetalol could be added in pregnancy should she need additional treatment for hypertension.  Given her excellent exercise tolerance in context of her hx of MI, she would have a NYHA classification of I and CarPreg score of 1, giving her excellent prognosis of <1% chance of maternal mortality and ~5% chance or less of a cardiovascular event (eg, MI, CVA, pathologic arrhythmia) as a result of pregnancy.  I discussed that in the setting of prior existing heart disease (CAD) that she should only have initiation/adjustment of antihypertensive medications at a threshold of 150/100.  For her this would represent severe range hypertension and would require delivery after 34 weeks due to hypertensive disorder of pregnancy with severe features, regardless of classification as severe preeclampsia or severe GHTN (ie, presence/absence of protein in the urine would not dictate any changes in the management unless nephrotic range proteinuria were present which would lead to need for prophylactic lovenox in setting of immobilization or post-operatively should she  need cesarean section for any obstetrical indications.  It is my practice and recommendation to assess maternal cardiac function early in gestation prior to 20 weeks and again at the peak of the physiologic changes of pregnancy at 28-30 weeks to assess tolerance of pregnancy.  Formal assessment at each time point will require reassessment of EKG and echocardiogram.  I explained to her she is minimally increased risk for deterioration with the physiologic changes anticipated in the late-trimester of pregnancy given her excellent cardiac function and her healthy lifestyle.  However, close follow up is usually recommended as a precaution. She should have serial assessments both clinically by physical examination and monitoring of symptoms during this later part of pregnancy. A multi-disciplinary approach replete with frequent assessments by both obsterics and cardiology will be needed. If she experiences symptoms of increasing dyspnea, chest pain, dizziness, fatigue, palpitations and/or sudden onset of edema, I explained to her that I desired she present to [place] for urgent evaluation during which we would facilitate a concurrent assessment by cardiology.  She demonstrated adequate understanding of her increased risk of both morbidity.  To moderate her risks and facilitate planning, I recommend that she have anesthesia consultation to follow echocardiogram and cardiology evaluation.  She should have telemetry in labor, early epidural to abate risks of arrhythmia with labor pains.  Additionally, it would not be appropriate to have a prolonged effort with 2nd stage of labor; ie, consideration may be entertained (although not clearly indicated at this point) for an assisted second stage with either forceps or vacuum.  As such, she should be allowed to labor, push, and have a spontaneous vaginal delivery provided  she is physically tolerating it well.  She may electively request assisted second stage as this is a  reasonable consideration given her cardiac history.  Additionally, I would recommend anesthesia reconsultation prior to labor. A common time to arrange such a meeting would be around 34-36 weeks and at admission. An early epidural in labor with adequate preload as per cardiology/anesthesia recommendations would be a general recommendation. Ie, Maternal tachyarrhythmias are more common to onset in labor in surgically repaired hearts may be limited by avoiding pain response. Stress hormone release are best avoided, namely catecholamines, as they can trigger an unstable cardiac rhythm. Oftentimes, arrhythmias can lead to syncopal episodes or even death in context of pain. Similarly, laboring and the associated pain response could trigger arrhythmia. Large fluid shifts as with hemorrhage incurred during a cesarean delivery pose more risk for triggering arrhythmia/hypoxic tissue injury. While not commonly encountered and this patient is of low but appreciable risk, this condition is best managed obstetrically by placement of epidural early in labor to block the pain response release of catecholamines.  The maternal cardiac status can be closely and meticulously followed in labor by implementing telemetry monitoring of maternal heart rate and rhythm, continuous pulse oximetry, and close matching of fluid status (I/O's with adequate matching of hydration and urine output monitored by way of foley catheter). I recommend that the patient have an assessment by anesthesiology for placement of early epidural; this assessment in my opinion is best done as a planning visit sometime in the third trimester.  Assisted second stage is recommended for this patient unless obstetrical condition warrants consideration for cesarean. To avoid excessive maternal expulsive forces, I recommended to your patient we would have her "labor down" in the second stage until she feels the urge to push. Following delivery, I recommend continuing  the telemetry for 24 hours and pulse oximetry for 12-24 hours. After this initial period of observation is without onset of CHF or arrhythmia, I would then simply obtain a pulse oximetry measurement routinely with vital signs every 4 hours until criteria for discharge are met.  Timed Delivery at 38 weeks for maternal heart disease may be the best option to avoid labor at home and to facilitate placement of early epidural. She may continue pregnancy until achieving term.  I additionally spoke to the patient regarding her needed fetal surveillance in setting of maternal CAD as follows:  twice weekly NST, weekly AFI, and interval growth all owing to risks for oligohydramnios, IUGR, and stillbirth.  After achieving viable gestation, it would be appropriate to educate her with preterm labor and preeclampsia precautions along with instructions for monitoring fetal activity in the late trimester (kick counts).  I then shifted my discussion to that of pregnancy in the context of hypothyroidism. Thyroid function tests should be checked monthly until normal thyroid function tests have been clearly established well into pregnancy then every trimester until delivery. There is a 1% incidence of hypothyroidism in the general population. The thyroid is important for normal growth and maintenance of lipid and carbohydrate metabolism. In pregnancy, the physiology changes somewhat due to increased amounts of hormones such as estrogen which blocks the degradation of thyroid binding globulin and human chorionic gonadotropin which stimulates thyroid hormone secretion and suppresses TSH. Throughout pregnancy, there is a 30-50% increase in the requirement of throxine. In the fetus, the small amount of thyroxine that crosses the placenta provides thyroid hormone until 10-12 weeks. After this time, the fetus begins to synthesis thyroid hormone.  Some  of the complications in pregnancy associated with untreated or partially treated  hypothyroidism are preeclampsia, abruption, preterm birth and low birth weight. When the maternal free T4 is very low in pregnancy, the infant is at risk for impaired psychomotor function and a significantly lower IQ. Therefore, recommendations are to maintain serum TSH 0.5-2.5 microIU/ml. Measure TSH every trimester because 50% of women need an increase in thyroxine replacement dose in pregnancy.  Her last TSH of 0.5 is within the target range.  Although this was a lot of information for her to digest, your patient demonstrated adequate comprehension of my impressions, recommendations and the underlying rationale. All of her questions were answered during the course of the consultation.  Summary of Recommendations for Maternal Management: 1. Prenatal vitamin po qd 2. Folic acid po qd is reasonable to continue 3. ASA 81 mg po qd preconception, throughout pregnancy and postpartum without discontinuation owing to the presence of the CAD and hx of stent placement. 4. Discontinuation of statin (Crestor) immediately given her desire to conceive.  Her husband is out of town until her next cycle so this medication should be well out of her system by the time they actively attempt conception. 5. Upon conception, recommend scheduling early dating ultrasound to confirm viability and EDC 6. 24 hour urine protein and baseline preeclampsia labs prior to [redacted] weeks gestation 7.  EKG and echocardiogram prior to 20 weeks and again at 28-32 weeks 8. Given AMA, genetic counseling at 10-12 weeks, noting patient is contemplating cell free fetal DNA if spontaneous conception occurs.  She will address parental karyotyping and carrier screening testing with [facility].  If IVF is utilized, she knows to discuss the option of preimplantation genetic testing with [facility].   9. Fetal survey at 18 weeks 10. Serial fetal growth ultrasounds from 22 weeks until delivery (q4 weeks) 11. Antenatal testing at 32 weeks (weekly BPP) 12. Anesthesia  consultation around 34-36 weeks 13. Delivery at 38 weeks or shortly thereafter (38-39 weeks) owing to maternal CAD 14.  Telemetry in labor and 24 hours postpartum 15. Early epidural in labor 16. Given that she has hypothyroidism, she knows that she wants to maintain meticulous control with her PCP of her TSH.  Upon conception, an anticipated 30% increase in synthroid is expected/recommended with the goal of maintaining TSH below 2.5 in pregnancy 17.  Continuous pulse oximetry in labor and for 1st 12-24 hours postpartum. Then q 4 hours with vitals postpartum. 23. If spontaneous labor does not ensue prior to 38-39 weeks, then medically indicated induction would best facilitate early epidural and limit unnecessary risks for development of gestational complications that she is at increased risk with her history of CAD/MI (eg, IUGR, preeclampsia, stillbirth). 5. Assisted second stage (FAVD/VAVD) is a consideration dependent on cardiac status, pulse, and maternal tolerance of pushing at time of delivery (defer to delivering provider's discretion)..  20  cesarean delivery should be reserved for usual obstetrical indications. 21.  As one would expect, maternal BP should be maintained below 150/100 owing to hx CAD and guidelines by ACOG for treatment of hypertension in pregnancy 88. She would have the option of prenatal care with MFM or prenatal care with a willing obstetrician with MFM co-management as a consultant  Time Spent:  I spent in excess of 60 minutes in consultation with this patient to review records, evaluate her case, and provide her with an adequate discussion and education. More than 60 minutes were spent in direct face-to-face counseling.  It was a pleasure  seeing your patient in the office today. Thank you for consultation. Please do not hesitate to contact our service for any further questions.  Thank you,  Delman Cheadle Harl Favor, Delman Cheadle, MD, MS, FACOG  Assistant  Professor  [facility] [place]

## 2016-12-27 ENCOUNTER — Ambulatory Visit (HOSPITAL_COMMUNITY)
Admission: RE | Admit: 2016-12-27 | Discharge: 2016-12-27 | Disposition: A | Discharge status: Home / Self Care | Payer: 59 | Source: Ambulatory Visit | Attending: Obstetrics and Gynecology | Admitting: Obstetrics and Gynecology

## 2016-12-27 DIAGNOSIS — N970 Female infertility associated with anovulation: Secondary | ICD-10-CM | POA: Insufficient documentation

## 2016-12-27 DIAGNOSIS — N979 Female infertility, unspecified: Secondary | ICD-10-CM

## 2016-12-27 MED ORDER — IOPAMIDOL (ISOVUE-300) INJECTION 61%
30.0000 mL | Freq: Once | INTRAVENOUS | Status: AC | PRN
  Administered 2016-12-27: 30 mL

## 2018-01-25 ENCOUNTER — Emergency Department (HOSPITAL_COMMUNITY)
Admission: EM | Admit: 2018-01-25 | Discharge: 2018-01-25 | Disposition: A | Discharge status: Home / Self Care | Payer: 59 | Attending: Emergency Medicine | Admitting: Emergency Medicine

## 2018-01-25 ENCOUNTER — Other Ambulatory Visit: Payer: Self-pay

## 2018-01-25 ENCOUNTER — Ambulatory Visit (INDEPENDENT_AMBULATORY_CARE_PROVIDER_SITE_OTHER)
Admission: EM | Admit: 2018-01-25 | Discharge: 2018-01-25 | Disposition: A | Discharge status: Home / Self Care | Payer: 59 | Source: Home / Self Care

## 2018-01-25 ENCOUNTER — Encounter (HOSPITAL_COMMUNITY): Payer: Self-pay

## 2018-01-25 DIAGNOSIS — R51 Headache: Secondary | ICD-10-CM

## 2018-01-25 DIAGNOSIS — J019 Acute sinusitis, unspecified: Secondary | ICD-10-CM | POA: Diagnosis not present

## 2018-01-25 DIAGNOSIS — E079 Disorder of thyroid, unspecified: Secondary | ICD-10-CM | POA: Insufficient documentation

## 2018-01-25 DIAGNOSIS — I10 Essential (primary) hypertension: Secondary | ICD-10-CM

## 2018-01-25 DIAGNOSIS — Z79899 Other long term (current) drug therapy: Secondary | ICD-10-CM | POA: Diagnosis not present

## 2018-01-25 DIAGNOSIS — Z7982 Long term (current) use of aspirin: Secondary | ICD-10-CM | POA: Diagnosis not present

## 2018-01-25 DIAGNOSIS — J0110 Acute frontal sinusitis, unspecified: Secondary | ICD-10-CM

## 2018-01-25 DIAGNOSIS — H81399 Other peripheral vertigo, unspecified ear: Secondary | ICD-10-CM | POA: Insufficient documentation

## 2018-01-25 LAB — CBC WITH DIFFERENTIAL/PLATELET
ABS IMMATURE GRANULOCYTES: 0.02 10*3/uL (ref 0.00–0.07)
BASOS ABS: 0 10*3/uL (ref 0.0–0.1)
Basophils Relative: 0 %
Eosinophils Absolute: 0 10*3/uL (ref 0.0–0.5)
Eosinophils Relative: 0 %
HEMATOCRIT: 45.8 % (ref 36.0–46.0)
HEMOGLOBIN: 14.6 g/dL (ref 12.0–15.0)
IMMATURE GRANULOCYTES: 0 %
LYMPHS ABS: 1.1 10*3/uL (ref 0.7–4.0)
LYMPHS PCT: 16 %
MCH: 29.9 pg (ref 26.0–34.0)
MCHC: 31.9 g/dL (ref 30.0–36.0)
MCV: 93.9 fL (ref 80.0–100.0)
Monocytes Absolute: 0.2 10*3/uL (ref 0.1–1.0)
Monocytes Relative: 4 %
NEUTROS ABS: 5.4 10*3/uL (ref 1.7–7.7)
NEUTROS PCT: 80 %
NRBC: 0 % (ref 0.0–0.2)
Platelets: 275 10*3/uL (ref 150–400)
RBC: 4.88 MIL/uL (ref 3.87–5.11)
RDW: 12.4 % (ref 11.5–15.5)
WBC: 6.8 10*3/uL (ref 4.0–10.5)

## 2018-01-25 LAB — URINALYSIS, ROUTINE W REFLEX MICROSCOPIC
Bilirubin Urine: NEGATIVE
Glucose, UA: NEGATIVE mg/dL
Ketones, ur: NEGATIVE mg/dL
LEUKOCYTES UA: NEGATIVE
NITRITE: NEGATIVE
PH: 6 (ref 5.0–8.0)
Protein, ur: NEGATIVE mg/dL
SPECIFIC GRAVITY, URINE: 1.02 (ref 1.005–1.030)

## 2018-01-25 LAB — BASIC METABOLIC PANEL
ANION GAP: 13 (ref 5–15)
BUN: 9 mg/dL (ref 6–20)
CHLORIDE: 103 mmol/L (ref 98–111)
CO2: 23 mmol/L (ref 22–32)
Calcium: 9.3 mg/dL (ref 8.9–10.3)
Creatinine, Ser: 0.95 mg/dL (ref 0.44–1.00)
GFR calc non Af Amer: >60 mL/min (ref >60)
Glucose, Bld: 135 mg/dL — ABNORMAL HIGH (ref 70–99)
Potassium: 5.3 mmol/L — ABNORMAL HIGH (ref 3.5–5.1)
SODIUM: 139 mmol/L (ref 135–145)

## 2018-01-25 LAB — URINALYSIS, MICROSCOPIC (REFLEX)

## 2018-01-25 LAB — I-STAT BETA HCG BLOOD, ED (MC, WL, AP ONLY)

## 2018-01-25 MED ORDER — MECLIZINE HCL 25 MG PO TABS
25.0000 mg | ORAL_TABLET | Freq: Once | ORAL | Status: AC
  Administered 2018-01-25: 25 mg via ORAL
  Filled 2018-01-25: qty 1

## 2018-01-25 MED ORDER — MECLIZINE HCL 25 MG PO TABS: 25.0000 mg | ORAL_TABLET | Freq: Three times a day (TID) | ORAL | 0 refills | Status: AC | PRN

## 2018-01-25 MED ORDER — LORATADINE 10 MG PO TABS: 10.0000 mg | ORAL_TABLET | Freq: Every day | ORAL | 0 refills | Status: AC

## 2018-01-25 MED ORDER — FLUTICASONE PROPIONATE 50 MCG/ACT NA SUSP: 2.0000 | Freq: Every day | NASAL | 0 refills | Status: AC

## 2018-01-25 NOTE — ED Notes (Signed)
Up to br

## 2018-01-25 NOTE — ED Triage Notes (Signed)
Pt brought in for having a high BP ; pt not currently on any BP medications ; pt states she use to take amlodipine but her PCP discontinued it since it was causing her feet to swell ; pt reports a slight headache along with being light headed ; pt alert and oriented x4

## 2018-01-25 NOTE — ED Provider Notes (Signed)
[facility]PART[facility] Provider Note   CSN: [id number] Arrival date & time: 01/25/18  1743     History   Chief Complaint Chief Complaint  Patient presents with  . Hypertension    HPI Deanna Solis is a 42 y.o. female.  HPI States she woke up with frontal pressure-like headache and dizziness.  Dizziness is associated with nausea and worse with movement of her head.  States she feels off balance.  Has taken her blood pressure several times with mildly elevated readings.  Denies any chest pain or shortness of breath.  No fever or chills.  No ear pain or tinnitus.  No focal weakness or numbness. Past Medical History:  Diagnosis Date  . Coronary artery disease   . Family history of malignant neoplasm of breast   . Hyperlipidemia   . Hypertension   . Migraines   . Ovarian cyst   . Submucous uterine fibroid   . Thyroid disease   . Thyroid disease    goiter    Patient Active Problem List   Diagnosis Date Noted  . Encounter for preconception consultation 12/26/2016  . Family history of malignant neoplasm of breast   . GOITER, MULTINODULAR 04/01/2007    Past Surgical History:  Procedure Laterality Date  . CARDIAC CATHETERIZATION  12/15/2015  . COLONOSCOPY     2003 & 2016  . CORONARY ANGIOPLASTY  11/03/207  . LAPAROSCOPY    . THYROIDECTOMY  2013     OB History   No obstetric history on file.      Home Medications    Prior to Admission medications   Medication Sig Start Date End Date Taking? Authorizing Provider  amLODipine (NORVASC) 5 MG tablet Take 5 mg daily by mouth.    [provider]  aspirin EC 81 MG tablet Take 81 mg by mouth daily.    [provider]  calcium carbonate (TUMS - DOSED IN MG ELEMENTAL CALCIUM) 500 MG chewable tablet Chew 50 mg by mouth daily. 02/14/14   [provider]  calcium carbonate (TUMS - DOSED IN MG ELEMENTAL CALCIUM) 500 MG chewable tablet Chew 1 tablet by mouth daily.     [provider]  fluticasone (FLONASE) 50 MCG/ACT nasal spray Place 2 sprays into both nostrils daily. 01/25/18   Julianne Rice, MD  folic acid (FOLVITE) 144 MCG tablet Take 400 mcg by mouth daily.    [provider]  hydrochlorothiazide (HYDRODIURIL) 25 MG tablet Take 25 mg by mouth daily. 07/17/15   [provider]  hydrochlorothiazide (HYDRODIURIL) 25 MG tablet Take 25 mg by mouth daily.    [provider]  ketoprofen (ORUDIS) 50 MG capsule Take 50 mg by mouth every 6 (six) hours as needed (migraines).    [provider]  ketoprofen (ORUDIS) 75 MG capsule Take 75-150 mg by mouth daily as needed for headache.    [provider]  levothyroxine (SYNTHROID, LEVOTHROID) 137 MCG tablet Take 137 mcg by mouth daily before breakfast.    [provider]  loratadine (CLARITIN) 10 MG tablet Take 1 tablet (10 mg total) by mouth daily. 01/25/18   Julianne Rice, MD  meclizine (ANTIVERT) 25 MG tablet Take 1 tablet (25 mg total) by mouth 3 (three) times daily as needed for dizziness. 01/25/18   Julianne Rice, MD  metoprolol succinate (TOPROL-XL) 25 MG 24 hr tablet Take 25 mg by mouth daily.    [provider]  Multiple Vitamin (MULTIVITAMIN) tablet Take 1 tablet by  mouth daily.    [provider]  Multiple Vitamins tablet Take 1 tablet by mouth daily.    [provider]  norethindrone (MICRONOR,CAMILA,ERRIN) 0.35 MG tablet Take 1 tablet by mouth daily. 04/24/15   [provider]  norethindrone (MICRONOR,CAMILA,ERRIN) 0.35 MG tablet Take 1 tablet by mouth daily.    [provider]  rosuvastatin (CRESTOR) 40 MG tablet Take 40 mg by mouth every evening.    [provider]  rosuvastatin (CRESTOR) 5 MG tablet Take 5 mg by mouth every evening. 10/09/15   [provider]  ticagrelor (BRILINTA) 90 MG TABS tablet Take 90 mg by mouth 2 (two) times daily.    [provider]    Family  History Family History  Problem Relation Age of Onset  . Heart attack Other   . Breast cancer Mother 83       deceased 19  . Pancreatic cancer Maternal Grandmother 90       deceased  . Pancreatic cancer Paternal Grandfather 54       deceased    Social History Social History   Tobacco Use  . Smoking status: Never Smoker  . Smokeless tobacco: Never Used  Substance Use Topics  . Alcohol use: Yes    Comment: socially  . Drug use: No     Allergies   Anesthetics, ester; Erythromycin base; Garlic; Onion; Other; Sumatriptan; Azithromycin; Penicillins; and Penicillins   Review of Systems Review of Systems  Constitutional: Negative for chills and fever.  HENT: Positive for sinus pressure. Negative for sore throat and trouble swallowing.   Eyes: Negative for visual disturbance.  Respiratory: Negative for cough and shortness of breath.   Cardiovascular: Negative for chest pain and leg swelling.  Gastrointestinal: Positive for nausea. Negative for abdominal pain, constipation, diarrhea and vomiting.  Genitourinary: Negative for dysuria, flank pain and frequency.  Musculoskeletal: Negative for back pain, myalgias and neck pain.  Skin: Negative for rash and wound.  Neurological: Positive for dizziness and headaches. Negative for syncope, weakness and numbness.  Psychiatric/Behavioral: The patient is nervous/anxious.   All other systems reviewed and are negative.    Physical Exam Updated Vital Signs BP 126/88   Pulse 91   Temp 98.1 F (36.7 C) (Oral)   Resp 18   Ht 5\' 6"  (1.676 m)   Wt 97.5 kg   LMP 01/24/2018   SpO2 99%   BMI 34.70 kg/m   Physical Exam Vitals signs and nursing note reviewed.  Constitutional:      General: She is not in acute distress.    Appearance: Normal appearance. She is well-developed. She is not ill-appearing.  HENT:     Head: Normocephalic and atraumatic.     Comments: Patient has bilateral frontal sinus tenderness to percussion.    Nose:  Nose normal.     Mouth/Throat:     Mouth: Mucous membranes are moist.     Pharynx: No oropharyngeal exudate.  Eyes:     Extraocular Movements: Extraocular movements intact.     Conjunctiva/sclera: Conjunctivae normal.     Pupils: Pupils are equal, round, and reactive to light.     Comments: Fatigable horizontal nystagmus  Neck:     Musculoskeletal: Normal range of motion and neck supple. No neck rigidity or muscular tenderness.     Vascular: No carotid bruit.  Cardiovascular:     Rate and Rhythm: Normal rate and regular rhythm.  Pulmonary:     Effort: Pulmonary effort is normal. No respiratory distress.  Breath sounds: Normal breath sounds. No stridor. No wheezing, rhonchi or rales.  Chest:     Chest wall: No tenderness.  Abdominal:     General: Bowel sounds are normal. There is no distension.     Palpations: Abdomen is soft.     Tenderness: There is no abdominal tenderness. There is no guarding or rebound.  Musculoskeletal: Normal range of motion.        General: No swelling, tenderness, deformity or signs of injury.     Right lower leg: No edema.     Left lower leg: No edema.  Lymphadenopathy:     Cervical: No cervical adenopathy.  Skin:    General: Skin is warm and dry.     Capillary Refill: Capillary refill takes less than 2 seconds.     Findings: No erythema or rash.  Neurological:     General: No focal deficit present.     Mental Status: She is alert and oriented to person, place, and time.     Comments: Patient is alert and oriented x3 with clear, goal oriented speech. Patient has 5/5 motor in all extremities. Sensation is intact to light touch. Bilateral finger-to-nose is normal with no signs of dysmetria.   Psychiatric:        Behavior: Behavior normal.     Comments: Mildly anxious appearing      ED Treatments / Results  Labs (all labs ordered are listed, but only abnormal results are displayed) Labs Reviewed  BASIC METABOLIC PANEL - Abnormal; Notable for  the following components:      Result Value   Potassium 5.3 (*)    Glucose, Bld 135 (*)    All other components within normal limits  URINALYSIS, ROUTINE W REFLEX MICROSCOPIC - Abnormal; Notable for the following components:   APPearance TURBID (*)    Hgb urine dipstick LARGE (*)    All other components within normal limits  URINALYSIS, MICROSCOPIC (REFLEX) - Abnormal; Notable for the following components:   Bacteria, UA FEW (*)    All other components within normal limits  CBC WITH DIFFERENTIAL/PLATELET  I-STAT BETA HCG BLOOD, ED ([facility], [facility], [facility] ONLY)    EKG None  Radiology No results found.  Procedures Procedures (including critical care time)  Medications Ordered in ED Medications  meclizine (ANTIVERT) tablet 25 mg (25 mg Oral Given 01/25/18 1909)     Initial Impression / Assessment and Plan / ED Course  I have reviewed the triage vital signs and the nursing notes.  Pertinent labs & imaging results that were available during my care of the patient were reviewed by me and considered in my medical decision making (see chart for details).    Dizziness is consistent with peripheral vertigo.  Symptoms are improved after meclizine.  Patient is ambulating without difficulty.  Likely has frontal sinusitis.  Will start back on her Claritin and on Flonase.  Her blood pressure has improved without specific treatment.  Suspect this likely is due to her vertiginous symptoms and anxiety.  Advised to follow-up with her cardiologist regarding blood pressure management.  Return precautions given.   Final Clinical Impressions(s) / ED Diagnoses   Final diagnoses:  Peripheral vertigo, unspecified laterality  Acute frontal sinusitis, recurrence not specified    ED Discharge Orders         Ordered    meclizine (ANTIVERT) 25 MG tablet  3 times daily PRN     01/25/18 2047    fluticasone (FLONASE) 50 MCG/ACT nasal spray  Daily  01/25/18 2047    loratadine (CLARITIN) 10 MG tablet  Daily      01/25/18 2047           Julianne Rice, MD 01/25/18 2049

## 2018-09-28 IMAGING — CR DG CHEST 2V
2 series · 2 of 2 positions shown · non-contrast
Comparison: None in PACs

CLINICAL DATA: Dull pain and numbness in the left side of the face
and left arm. Similar symptoms in the chest in the midline to the
left for the past 2 days. History of hypertension.

EXAM:
CHEST  2 VIEW

[chest pa]
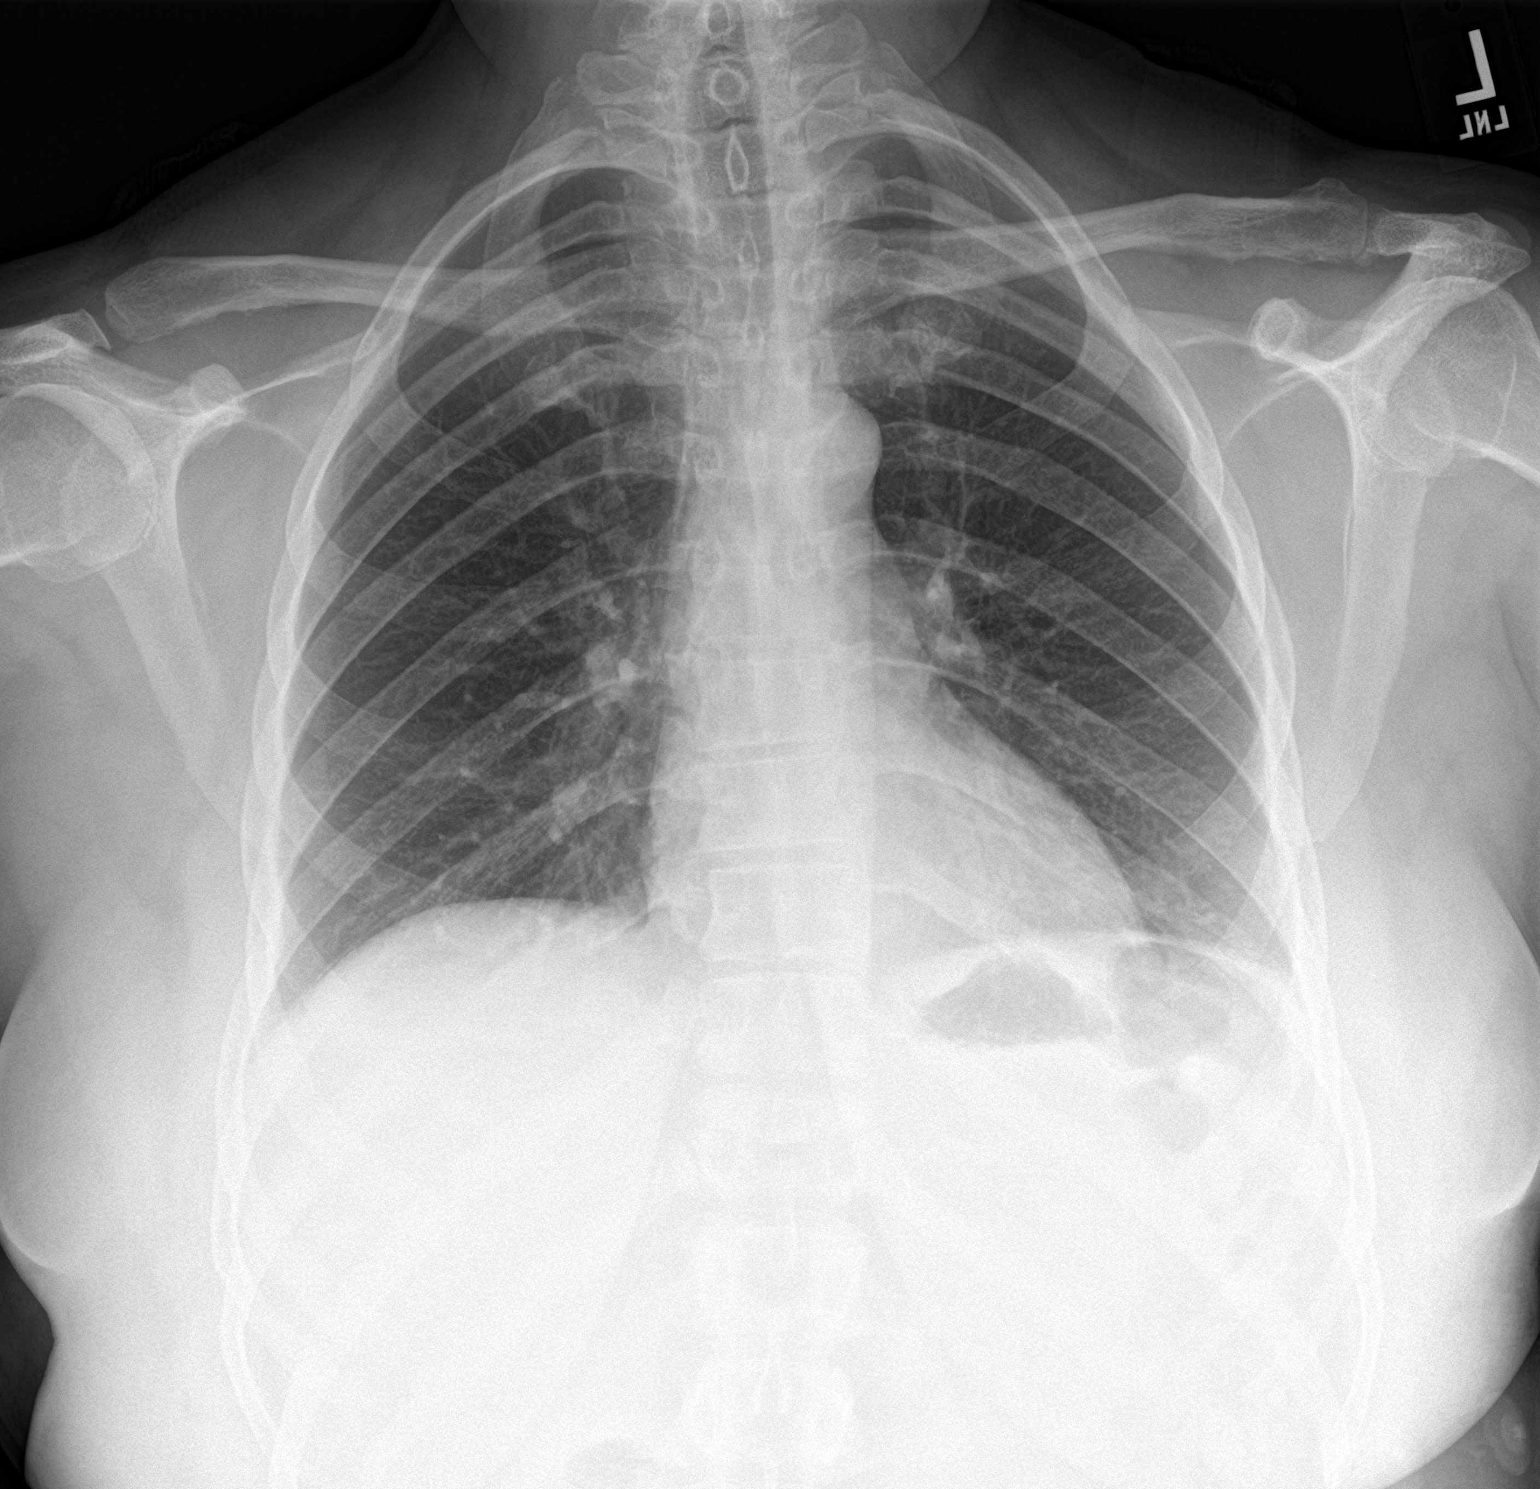

[chest lat]
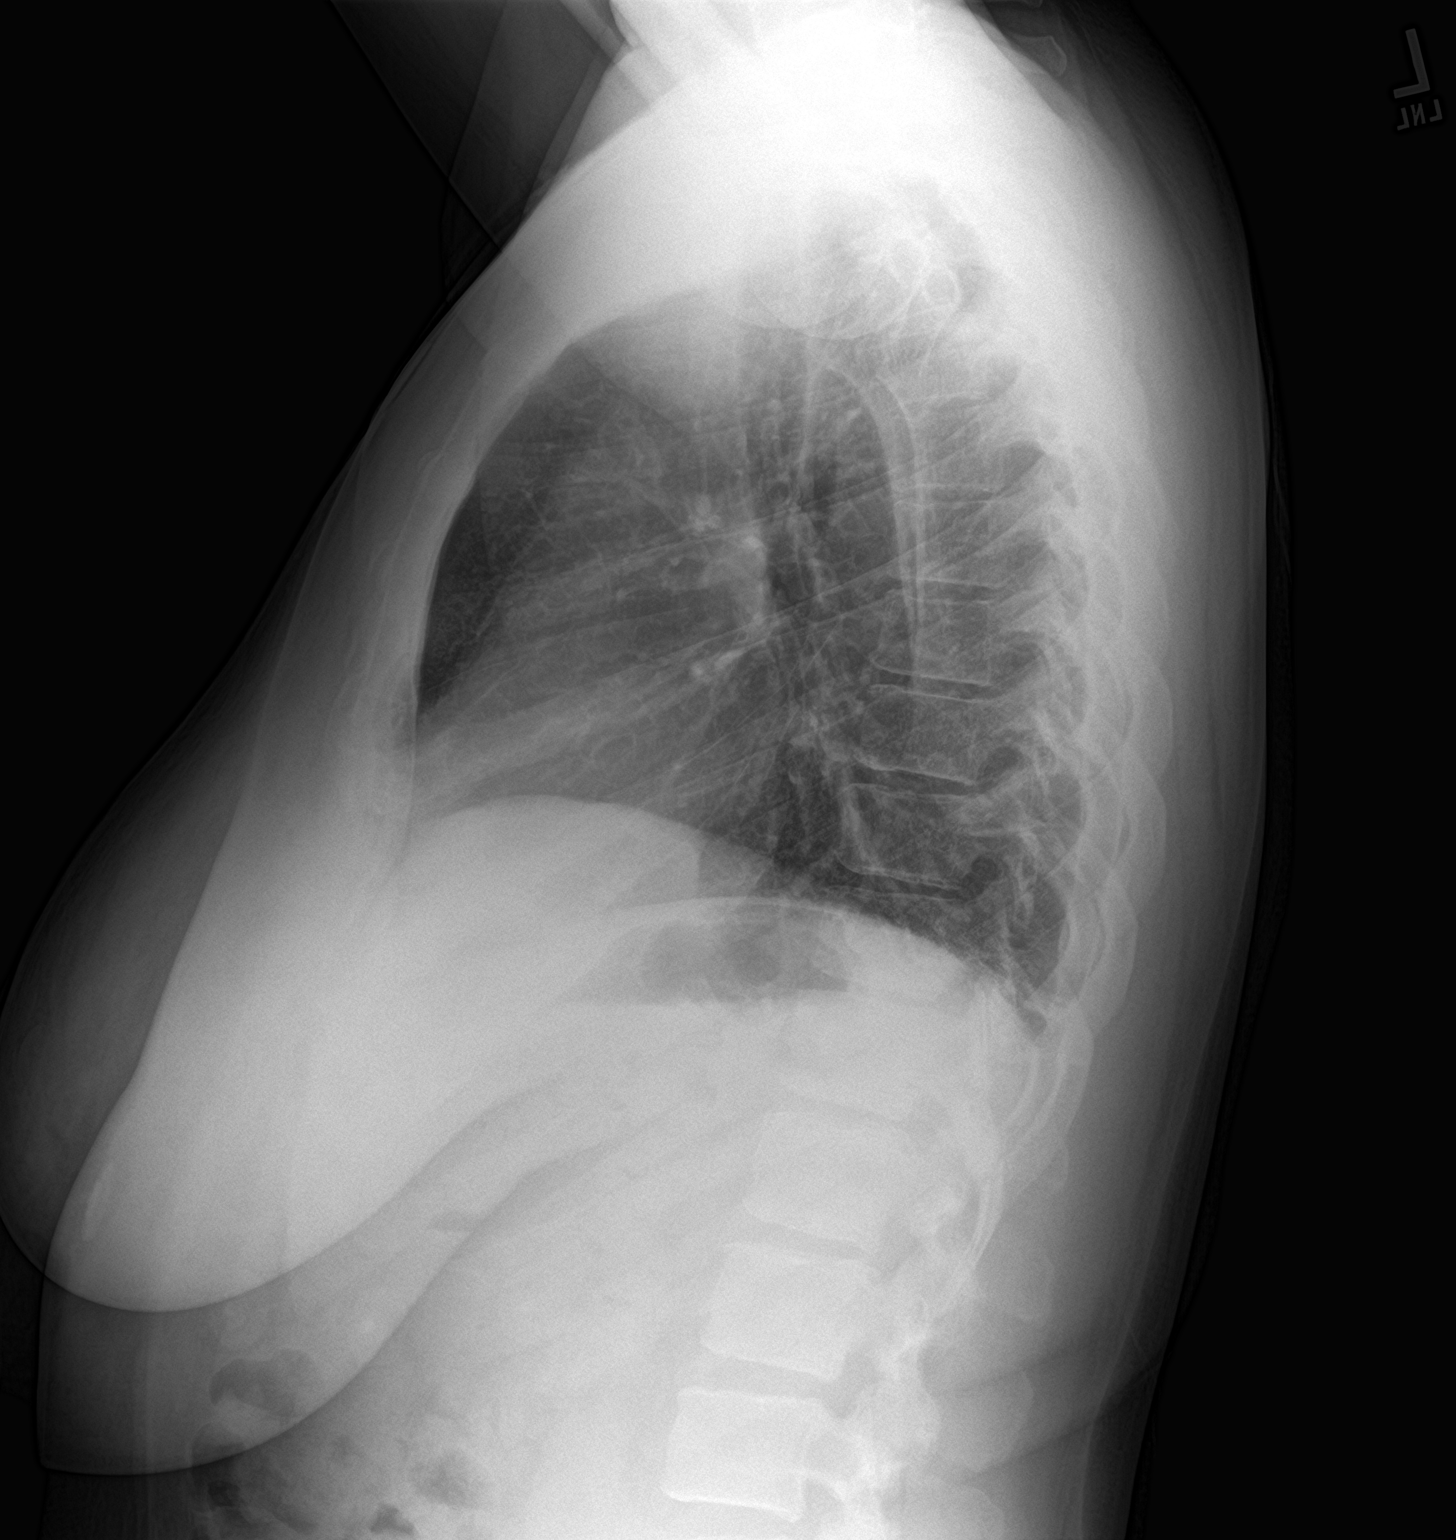

[2 of 2 positions shown; findings below may reference images not displayed]

FINDINGS: The lungs are adequately inflated. There is no focal infiltrate.
There is no pneumothorax, pneumomediastinum, or pleural effusion.
The heart and pulmonary vascularity are normal. There is
calcification in the wall of the aortic arch. The bony thorax
exhibits no acute abnormality.
IMPRESSION: There is no active cardiopulmonary disease.

## 2021-09-01 LAB — GLUCOSE, POCT (MANUAL RESULT ENTRY): POC Glucose: 130 mg/dl — AB (ref 70–99)

## 2022-04-04 ENCOUNTER — Encounter: Payer: Self-pay | Admitting: *Deleted

## 2022-04-04 NOTE — Progress Notes (Signed)
Pt seen at 03/23/22 event when b/p was 132/94 - SDOH reviewed with no needs noted and PCP, per pt confirmation, is Roe Coombs, PA-C at [facility] primary care clinic, where pt was seen for annual CPX on 01/15/2022. Pt also has documented future appt with [facility] cardiology and pulmonary and confirms she has her b/p results and is making a f/u appt with her PCP shortly. No additional health equity team support indicated at this time.
